# Patient Record
Sex: Female | Born: 1945
Health system: Southern US, Community
[De-identification: ages and names within clinical notes are randomized; demographics above are authoritative.]

## PROBLEM LIST (undated history)

## (undated) DIAGNOSIS — F419 Anxiety disorder, unspecified: Secondary | ICD-10-CM

## (undated) DIAGNOSIS — H3530 Unspecified macular degeneration: Secondary | ICD-10-CM

## (undated) DIAGNOSIS — R0602 Shortness of breath: Secondary | ICD-10-CM

## (undated) DIAGNOSIS — F99 Mental disorder, not otherwise specified: Secondary | ICD-10-CM

## (undated) DIAGNOSIS — I10 Essential (primary) hypertension: Secondary | ICD-10-CM

## (undated) DIAGNOSIS — G459 Transient cerebral ischemic attack, unspecified: Secondary | ICD-10-CM

## (undated) DIAGNOSIS — F32A Depression, unspecified: Secondary | ICD-10-CM

## (undated) DIAGNOSIS — F329 Major depressive disorder, single episode, unspecified: Secondary | ICD-10-CM

## (undated) DIAGNOSIS — H269 Unspecified cataract: Secondary | ICD-10-CM

## (undated) DIAGNOSIS — K219 Gastro-esophageal reflux disease without esophagitis: Secondary | ICD-10-CM

## (undated) DIAGNOSIS — N189 Chronic kidney disease, unspecified: Secondary | ICD-10-CM

## (undated) DIAGNOSIS — F3181 Bipolar II disorder: Secondary | ICD-10-CM

## (undated) DIAGNOSIS — J449 Chronic obstructive pulmonary disease, unspecified: Secondary | ICD-10-CM

## (undated) DIAGNOSIS — M1990 Unspecified osteoarthritis, unspecified site: Secondary | ICD-10-CM

## (undated) HISTORY — PX: APPENDECTOMY: SHX54

## (undated) HISTORY — PX: CATARACT EXTRACTION, BILATERAL: SHX1313

## (undated) HISTORY — PX: INNER EAR SURGERY: SHX679

## (undated) HISTORY — DX: Unspecified cataract: H26.9

## (undated) HISTORY — PX: CHOLECYSTECTOMY: SHX55

## (undated) HISTORY — PX: CARDIAC CATHETERIZATION: SHX172

## (undated) HISTORY — PX: ABDOMINAL HYSTERECTOMY: SHX81

## (undated) HISTORY — DX: Unspecified macular degeneration: H35.30

## (undated) HISTORY — PX: COLON SURGERY: SHX602

---

## 1975-03-05 HISTORY — PX: FOOT SURGERY: SHX648

## 1976-03-04 HISTORY — PX: CHOLECYSTECTOMY: SHX55

## 1976-03-04 HISTORY — PX: APPENDECTOMY: SHX54

## 1978-03-04 HISTORY — PX: ABDOMINAL HYSTERECTOMY: SHX81

## 1985-03-04 HISTORY — PX: BREAST CYST ASPIRATION: SHX578

## 1993-03-04 HISTORY — PX: CARDIAC CATHETERIZATION: SHX172

## 1997-09-15 ENCOUNTER — Ambulatory Visit (HOSPITAL_COMMUNITY): Admission: RE | Admit: 1997-09-15 | Discharge: 1997-09-15 | Payer: Self-pay | Admitting: General Surgery

## 1999-06-08 ENCOUNTER — Ambulatory Visit (HOSPITAL_COMMUNITY): Admission: RE | Admit: 1999-06-08 | Discharge: 1999-06-08 | Payer: Self-pay | Admitting: Internal Medicine

## 1999-06-08 ENCOUNTER — Encounter: Payer: Self-pay | Admitting: Internal Medicine

## 1999-07-09 ENCOUNTER — Ambulatory Visit (HOSPITAL_COMMUNITY): Admission: RE | Admit: 1999-07-09 | Discharge: 1999-07-09 | Payer: Self-pay | Admitting: Internal Medicine

## 1999-07-15 ENCOUNTER — Ambulatory Visit (HOSPITAL_COMMUNITY): Admission: RE | Admit: 1999-07-15 | Discharge: 1999-07-15 | Payer: Self-pay | Admitting: Psychiatry

## 2000-06-30 ENCOUNTER — Encounter: Admission: RE | Admit: 2000-06-30 | Discharge: 2000-06-30 | Payer: Self-pay | Admitting: Internal Medicine

## 2000-06-30 ENCOUNTER — Encounter: Payer: Self-pay | Admitting: Internal Medicine

## 2000-07-03 ENCOUNTER — Encounter: Payer: Self-pay | Admitting: Internal Medicine

## 2000-07-03 ENCOUNTER — Encounter: Admission: RE | Admit: 2000-07-03 | Discharge: 2000-07-03 | Payer: Self-pay | Admitting: Internal Medicine

## 2000-07-30 ENCOUNTER — Encounter: Payer: Self-pay | Admitting: Gastroenterology

## 2000-07-30 ENCOUNTER — Ambulatory Visit (HOSPITAL_COMMUNITY): Admission: RE | Admit: 2000-07-30 | Discharge: 2000-07-30 | Payer: Self-pay | Admitting: Gastroenterology

## 2000-08-28 ENCOUNTER — Ambulatory Visit (HOSPITAL_COMMUNITY): Admission: RE | Admit: 2000-08-28 | Discharge: 2000-08-28 | Payer: Self-pay | Admitting: Internal Medicine

## 2000-08-28 ENCOUNTER — Encounter: Payer: Self-pay | Admitting: Internal Medicine

## 2000-09-02 ENCOUNTER — Encounter: Payer: Self-pay | Admitting: Internal Medicine

## 2000-09-02 ENCOUNTER — Encounter: Admission: RE | Admit: 2000-09-02 | Discharge: 2000-09-02 | Payer: Self-pay | Admitting: Internal Medicine

## 2001-03-04 HISTORY — PX: PARTIAL COLECTOMY: SHX5273

## 2002-01-04 ENCOUNTER — Encounter (INDEPENDENT_AMBULATORY_CARE_PROVIDER_SITE_OTHER): Payer: Self-pay | Admitting: *Deleted

## 2002-01-04 ENCOUNTER — Ambulatory Visit (HOSPITAL_COMMUNITY): Admission: RE | Admit: 2002-01-04 | Discharge: 2002-01-04 | Payer: Self-pay | Admitting: Gastroenterology

## 2002-01-13 ENCOUNTER — Encounter: Payer: Self-pay | Admitting: General Surgery

## 2002-01-15 ENCOUNTER — Inpatient Hospital Stay (HOSPITAL_COMMUNITY): Admission: RE | Admit: 2002-01-15 | Discharge: 2002-01-20 | Payer: Self-pay | Admitting: General Surgery

## 2002-01-15 ENCOUNTER — Encounter (INDEPENDENT_AMBULATORY_CARE_PROVIDER_SITE_OTHER): Payer: Self-pay | Admitting: *Deleted

## 2002-06-28 ENCOUNTER — Other Ambulatory Visit (HOSPITAL_COMMUNITY): Admission: RE | Admit: 2002-06-28 | Discharge: 2002-07-13 | Payer: Self-pay | Admitting: Psychiatry

## 2003-01-12 ENCOUNTER — Observation Stay (HOSPITAL_COMMUNITY): Admission: AD | Admit: 2003-01-12 | Discharge: 2003-01-13 | Payer: Self-pay | Admitting: Internal Medicine

## 2003-02-08 ENCOUNTER — Ambulatory Visit (HOSPITAL_COMMUNITY): Admission: RE | Admit: 2003-02-08 | Discharge: 2003-02-08 | Payer: Self-pay | Admitting: Internal Medicine

## 2003-03-23 ENCOUNTER — Encounter: Admission: RE | Admit: 2003-03-23 | Discharge: 2003-03-23 | Payer: Self-pay | Admitting: Internal Medicine

## 2004-10-05 ENCOUNTER — Other Ambulatory Visit (HOSPITAL_COMMUNITY): Admission: RE | Admit: 2004-10-05 | Discharge: 2004-10-24 | Payer: Self-pay | Admitting: Psychiatry

## 2004-10-05 ENCOUNTER — Ambulatory Visit: Payer: Self-pay | Admitting: Psychiatry

## 2006-06-25 ENCOUNTER — Encounter: Admission: RE | Admit: 2006-06-25 | Discharge: 2006-06-25 | Payer: Self-pay | Admitting: Internal Medicine

## 2006-09-18 ENCOUNTER — Ambulatory Visit (HOSPITAL_COMMUNITY): Admission: RE | Admit: 2006-09-18 | Discharge: 2006-09-18 | Payer: Self-pay | Admitting: Internal Medicine

## 2006-09-26 ENCOUNTER — Encounter: Admission: RE | Admit: 2006-09-26 | Discharge: 2006-09-26 | Payer: Self-pay | Admitting: Internal Medicine

## 2007-03-18 ENCOUNTER — Encounter: Admission: RE | Admit: 2007-03-18 | Discharge: 2007-03-18 | Payer: Self-pay | Admitting: Internal Medicine

## 2007-09-21 ENCOUNTER — Encounter: Admission: RE | Admit: 2007-09-21 | Discharge: 2007-09-21 | Payer: Self-pay | Admitting: Internal Medicine

## 2008-10-06 ENCOUNTER — Encounter: Admission: RE | Admit: 2008-10-06 | Discharge: 2008-10-06 | Payer: Self-pay | Admitting: Internal Medicine

## 2009-11-29 ENCOUNTER — Encounter: Admission: RE | Admit: 2009-11-29 | Discharge: 2009-11-29 | Payer: Self-pay | Admitting: *Deleted

## 2010-03-25 ENCOUNTER — Encounter: Payer: Self-pay | Admitting: Internal Medicine

## 2010-07-20 NOTE — Consult Note (Signed)
NAME:  Caitlin Galloway, Caitlin Galloway                          ACCOUNT NO.:  [id number]   MEDICAL RECORD NO.:  [id number]                   PATIENT TYPE:  INP   LOCATION:  2041                                 FACILITY:  [facility]   PHYSICIAN:  Lesleigh Noe, M.D.            DATE OF BIRTH:  08-29-1945   DATE OF CONSULTATION:  01/12/2003  DATE OF DISCHARGE:                                   CONSULTATION   CONCLUSIONS:  1. Chest tightness and dyspnea, etiology uncertain.  Chest discomfort     started January 06, 2003, and dyspnea has been increasing over the past     four months.  2. Hyperlipidemia with elevated HDL.  3. History of cigarette smoking greater than 35 years.  4. History of villous adenoma.  5. Hypokalemia.   RECOMMENDATIONS:  1. Subcutaneous Lovenox.  2. Beta blocker therapy.  3. IV nitroglycerin if continued pain.  4. EKG.  5. Coronary angiography January 13, 2003.   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  Over the past four months, she has had  dyspnea on exertion, increasingly severe, and over the past week to ten  days, recurring chest tightness that has been nonpredictable occurring both  with rest and during exertion.  She currently has a mild continuing  sensation of pressure in her chest at the time of this interview.   FAMILY HISTORY:  Father died at age 22 of COPD, had history of CVA.  Mother  is alive and well.  Has five siblings, all in good health.   HABITS:  Greater than one pack per day for 35 years.  Denies significant  alcohol consumption, but does have one to two drinks per month.   MEDICATIONS:  1. Zoloft.  2. Estrace.  3. Clonopin.  4. Aspirin.   PHYSICAL EXAMINATION:  GENERAL APPEARANCE:  Pam appears comfortable.  VITAL SIGNS:  Blood pressure 147/80, heart rate 80.  HEENT:  Unremarkable.  No xanthelasma.  NECK:  No JVD or carotid bruits.  LUNGS:  Clear to auscultation and  percussion.  CARDIAC:  Normal.  ABDOMEN:  Soft.  EXTREMITIES:  No edema.   LABORATORY DATA:  Potassium 3.3, EKG normal.   DISCUSSION:  The patient has risk factors for coronary disease,  predominantly her age and cigarette smoking.  Under the circumstances and  with continued chest discomfort responsive to nitroglycerin, coronary  angiography needs to be done to exclude coronary artery disease.  Other  potential explanations for her symptoms are gastroesophageal reflux, stress  related and other.                                               Lesleigh Noe, M.D.  HWS/MEDQ  D:  01/13/2003  T:  01/13/2003  Job:  [id number]

## 2010-07-20 NOTE — Cardiovascular Report (Signed)
NAME:  Caitlin Galloway, Caitlin Galloway                          ACCOUNT NO.:  [id number]   MEDICAL RECORD NO.:  [id number]                   PATIENT TYPE:  INP   LOCATION:  2041                                 FACILITY:  [facility]   PHYSICIAN:  Lesleigh Noe, M.D.            DATE OF BIRTH:  20-Jun-1945   DATE OF PROCEDURE:  01/13/2003  DATE OF DISCHARGE:                              CARDIAC CATHETERIZATION   INDICATIONS FOR PROCEDURE:  Recurrent chest pain syndrome compatible with  potential unstable angina.  The procedure is being done to rule out coronary  artery disease and to include or exclude the possibility of heart related  pain.   DATE OF PROCEDURE:  January 13, 2003.   PROCEDURE PERFORMED:  1. Left heart catheterization.  2. Selective coronary angiography.  3. Left ventriculography.   DESCRIPTION:  After informed consent, a 6-French sheath was inserted in the  right femoral artery using modified Seldinger technique.  A 6-French A2  multipurpose catheter was used for hemodynamic recordings, left  ventriculography and selective left and right coronary angiography.  The  patient tolerated the procedure without significant complications.   RESULTS:   I. HEMODYNAMIC DATA:  A.  Aortic pressure 159/82.  B.  Left ventricular pressure 157/6mmHg.   II. LEFT VENTRICULOGRAPHY:  The left ventricle appears normal in size and  there is normal contractility.  EF is 65%.   III. CORONARY ANGIOGRAPHY:  A.  Left main coronary:  Left main coronary  artery is normal.  B.  Left anterior descending coronary:  The left anterior descending  coronary artery is normal.  No significant obstructive lesions are noted.  A  relatively small diagonal arises from the mid vessel and the from the distal  third of the vessel.  C.  Circumflex artery:  The circumflex artery is a large vessel that gives  origin to four obtuse marginal branches.  The third obtuse marginal is a  dominant vessel and bifurcates on the  inferolateral wall.  There are luminal  irregularities with 30% narrowing in the continuation of the circumflex  beyond the third obtuse marginal.  D.  Right coronary:  The right coronary artery is dominant and is free of  any significant obstruction.  PDA is free of any abnormalities.   CONCLUSIONS:  1. Minimal coronary atherosclerosis involving primarily the distal     circumflex with irregularities noted, but no high grade obstruction.  2. Normal left ventricular function.  3. Chest discomfort, probably nonischemic in origin.    PLAN:  1. No further cardiac evaluation for ischemic etiology of discomfort.  2. Clinic followup per Dr. Earl Gala.                                               Lesleigh Noe, M.D.  HWS/MEDQ  D:  01/13/2003  T:  01/13/2003  Job:  [id number]

## 2010-07-20 NOTE — Op Note (Signed)
NAME:  Caitlin Galloway, Caitlin Galloway                          ACCOUNT NO.:  [id number]   MEDICAL RECORD NO.:  [id number]                   PATIENT TYPE:  OUT   LOCATION:  MAMO                                 FACILITY:  [facility]   PHYSICIAN:  James L. Malon Kindle., M.D.          DATE OF BIRTH:  1946-02-07   DATE OF PROCEDURE:  01/04/2002  DATE OF DISCHARGE:                                 OPERATIVE REPORT   PROCEDURE PERFORMED:  Colonoscopy and biopsy.   ENDOSCOPIST:  Llana Aliment. Edwards, M.D.   MEDICATIONS:  Fentanyl 100 mcg, Versed 8 mg IV.   INSTRUMENT USED:  Pediatric Olympus colonoscope.   INDICATIONS FOR PROCEDURE:  Recent diarrhea, loose stools.  I saw her back  in July for this.  Work-up was negative.  Stools were negative.  We thought  that she probably had an infectious diarrhea since she was getting better.  I cultured her stools.  This all seemed to improve but she still had loose  stools.  In addition to this, she has family history of colon polyps.  For  these reasons a colonoscopy was performed.   DESCRIPTION OF PROCEDURE:  The procedure had been explained to the patient  and consent obtained.  With the patient in the left lateral decubitus  position, the scope was inserted and advanced under direct visualization.  The prep was excellent.  We were able to reach the cecum.  The ileocecal  valve was seen.  The scope was withdrawn and the cecum, ascending colon were  seen.  Upon reaching the cecum, the ileocecal valve was clearly seen.  Across from the ileocecal valve was a large polypoid mass that was estimated  to be 3 to 4 cm in diameter basically filling the entire wall of the cecum  across from the ileocecal valve.  This was hard, friable and ulcerated.  Multiple biopsies were obtained.  It was not felt to be something that was  endoscopically amenable to therapy.  The scope was withdrawn.  The ascending  colon, transverse, descending and sigmoid colon were normal.  There were no  signs of active colitis.  The patient had had diarrhea so in the descending  and sigmoid colon, multiple random biopsies were obtained and placed in jar  #2.  The scope was withdrawn.  The patient tolerated the procedure well.  The patient was monitored throughout and had good oxygen saturations with no  obvious problems.    ASSESSMENT:  Cecal mass, probably polyp or carcinoma.  It is not  endoscopically amenable to therapy with grossly normal mucosa; biopsies  obtained randomly to look for microscopic colitis.   PLAN:  Will check pathology.  Will obtained a surgical consultation.  James L. Malon Kindle., M.D.    Waldron Session  D:  01/04/2002  T:  01/04/2002  Job:  [id number]   cc:   Theressa Millard, M.D.  [address] [place]  [place] [address]  Fax: [phone]   Gita Kudo, M.D.  Fax: [phone]

## 2010-07-20 NOTE — Discharge Summary (Signed)
   NAME:  Caitlin Galloway, Caitlin Galloway                          ACCOUNT NO.:  [id number]   MEDICAL RECORD NO.:  [id number]                   PATIENT TYPE:  INP   LOCATION:  5737                                 FACILITY:  [facility]   PHYSICIAN:  Gita Kudo, M.D.              DATE OF BIRTH:  1945-11-27   DATE OF ADMISSION:  01/15/2002  DATE OF DISCHARGE:  01/20/2002                                 DISCHARGE SUMMARY   CHIEF COMPLAINT:  Colon lesion, found on colonoscopy, admitted for elective  surgery.   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  No family history of colon cancer.  Her past medical  history showed cholecystectomy, hysterectomy.  She takes esterase, Lexapro,  and Klonopin.   LABORATORY DATA:  Pathology:  Right colon and terminal ileum with  tubovillous adenoma, 3.5 cm.  No malignancy.   Chest x-ray showed no evidence of infiltrate or congestive heart failure.  Oblique scarring atelectasis, right middle lobe.   EKG:  Sinus bradycardia.   CBC, CMET, UA all within normal limits.   HOSPITAL COURSE:  On the morning of admission, the patient underwent a right  colectomy without complication.  Postoperatively, she did well.  She  regained all function, was comfortable, afebrile, and had no complication.  She did have a little right eye irritation that was treated with antibiotic  drops with good resolution.  On her fifth postoperative day, her staples  were removed; she was allowed home.  She was going home on a regular diet,  limited activity, Percocet for pain and followup as instructed.   DISCHARGE DIAGNOSIS:  Tubovillous adenoma, right colon.   OPERATION:  01/15/2002, right colectomy.    COMPLICATIONS, INFECTIONS, CONSULTATIONS:  None.   CONDITION ON DISCHARGE:  Good.                                                Gita Kudo, M.D.    MRL/MEDQ  D:  01/20/2002  T:  01/20/2002  Job:  [id number]   cc:   Theressa Millard, M.D.  [address] [place]  [place] [address]  Fax: [phone]   Llana Aliment. Malon Kindle., M.D.  [address]  [place]  [place] [address]  Fax: [phone]

## 2010-07-20 NOTE — Op Note (Signed)
NAME:  Caitlin Galloway, Caitlin Galloway                          ACCOUNT NO.:  [id number]   MEDICAL RECORD NO.:  [id number]                   PATIENT TYPE:  INP   LOCATION:  5731                                 FACILITY:  [facility]   PHYSICIAN:  Gita Kudo, M.D.              DATE OF BIRTH:  [birth year]   DATE OF PROCEDURE:  01/15/2002  DATE OF DISCHARGE:                                 OPERATIVE REPORT   PREOPERATIVE DIAGNOSIS:  Lesion of right colon.   POSTOPERATIVE DIAGNOSIS:  Lesion of right colon, probably benign per gross  exam by pathology.  Final diagnosis pending permanent result.   OPERATIVE PROCEDURE:  Right colon resection.   SURGEON:  Gita Kudo, M.D.   ASSISTANT:  Donnie Coffin. Samuella Cota, M.D.   ANESTHESIA:  general endotracheal.   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  It is at least 3 cm in  size and needs to be resected.   OPERATIVE FINDINGS:  The patient had adhesions from previous gallbladder and  hysterectomy.  The liver looked and felt normal.  The colon had a small  lesion at the cecum.  There was no evidence of any nodularity or fluid in  the abdomen.  The previous history of pancreatitis led to manual exam of the  pancreas, which felt normal.   DESCRIPTION OF PROCEDURE:  Under satisfactory general endotracheal  anesthesia, having received intravenous Cefotan and IM heparin, the  patient's abdomen was prepped and draped in a standard fashion.  A  transverse incision was made at the umbilicus and carried into the abdominal  cavity.  Bleeders were coagulated or tied with 2-0 silk.  Adhesions were  taken down and then laparotomy performed and self-retaining retractor  placed.  The abdomen was actually quite free of any dense adhesions.  The  colon was retracted medially and the peritoneal reflexion taken down and  extended to the distal ileum.  At that point, approximately 2 inches  proximal to the cecum, the ileum was transected with a GIA.  Then the  hepatic flexure was taken down between clamps and ties of 2-0 silk, again  using Bovie also and at the most proximal right portion of the transverse  colon it was divided with the GIA stapler.  The middle colic vessels were  carefully preserved.  The mesentery was then scored with the Bovie and the  vessels divided between clamps and ties of 2-0 silk and the specimen removed  and sent to pathology.  The duodenum and ureter were identified and not  injured.  The bowel was then aligned in a side-to-side fashion and a GIA  stapler used to performed a side-to-side end-to-end functional anastomosis.  The staple line was checked for hemostasis and secured with interrupted 3-0  pop-off sutures and then the staple line stab wounds closed with  interrupted  3-0 silk in simple and [place] fashion.  The anastomosis lay without tension,  was patent, and had good blood supply.  Gloves were changed and the abdomen  was lavaged with saline.  The mesentery was closed with interrupted 2-0 silk  suture.  Then the anastomosis was replaced in the right gutter and small  bowel over this and then the omentum.  Again the abdomen was lavaged with  saline and suctioned dry.  The sponge and needle counts were correct and the  closure accomplished with running 1-0 PDS suture in layers.  The incision  was infiltrated with Marcaine and then the skin edges approximated with  staples.  Sterile dressings were then applied and the patient went to the  recovery room from the operating room in good condition.   OPERATIVE TIME:  Approximately 1 hour and 30 minutes.   BLOOD LOSS:  Minimal.   COMPLICATIONS:  None.   DRAINS:  None.                                               Gita Kudo, M.D.    MRL/MEDQ  D:  01/15/2002  T:  01/16/2002  Job:  [id number]   cc:   Fayrene Fearing L. Malon Kindle., M.D.  [address]  [place]  [place] [address]   Fax: [phone]   Theressa Millard, M.D.  [address] [place]  [place] [address]  Fax: [phone]

## 2011-01-29 ENCOUNTER — Other Ambulatory Visit: Payer: Self-pay | Admitting: Internal Medicine

## 2011-01-29 DIAGNOSIS — Z1231 Encounter for screening mammogram for malignant neoplasm of breast: Secondary | ICD-10-CM

## 2011-02-21 ENCOUNTER — Ambulatory Visit: Payer: Self-pay

## 2011-03-28 ENCOUNTER — Ambulatory Visit
Admission: RE | Admit: 2011-03-28 | Discharge: 2011-03-28 | Disposition: A | Discharge status: Home / Self Care | Payer: Medicare Other | Source: Ambulatory Visit | Attending: Internal Medicine | Admitting: Internal Medicine

## 2011-03-28 DIAGNOSIS — Z1231 Encounter for screening mammogram for malignant neoplasm of breast: Secondary | ICD-10-CM

## 2011-10-29 ENCOUNTER — Ambulatory Visit (INDEPENDENT_AMBULATORY_CARE_PROVIDER_SITE_OTHER): Payer: Medicare Other | Admitting: Family Medicine

## 2011-10-29 ENCOUNTER — Ambulatory Visit: Payer: Medicare Other

## 2011-10-29 VITALS — BP 154/85 | HR 90 | Temp 98.2°F | Resp 18 | Wt 168.0 lb

## 2011-10-29 DIAGNOSIS — S63509A Unspecified sprain of unspecified wrist, initial encounter: Secondary | ICD-10-CM

## 2011-10-29 DIAGNOSIS — M25539 Pain in unspecified wrist: Secondary | ICD-10-CM

## 2011-10-29 NOTE — Patient Instructions (Signed)
Wear splint Advil 2 twice daily

## 2011-10-29 NOTE — Progress Notes (Signed)
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 She is left handed, so it is not present for her. The cord snapped tight and yanked her wrist backwards. She had immediate pain in the wrist comment it swelled up quickly. It hurts to pronate or supinate from the start. She did not hear a definite crack.  Object Patient is holding her wrist up from the pain. There is obvious swelling proximal to the joint itself. Mild discoloration and ecchymotic appearance distal to the swollen area. There is pain on movement of the wrist, and tenderness to palpation in the distal forearm, especially the ulnar side.  Assessment: Procedure in pain  Plan: X-ray wrist  [facility] reading (PRIMARY) by  Dr. Alwyn Ren Normal  Nsaids, splint .

## 2011-10-29 NOTE — Progress Notes (Signed)
  Subjective:    Patient ID: Caitlin Galloway, female    DOB: 10-15-1945, 66 y.o.   MRN: [id number]  HPI    Review of Systems     Objective:   Physical Exam        Assessment & Plan:

## 2012-05-05 ENCOUNTER — Observation Stay (HOSPITAL_COMMUNITY)
Admission: EM | Admit: 2012-05-05 | Discharge: 2012-05-06 | Disposition: A | Discharge status: Home / Self Care | Payer: Medicare Other | Attending: Internal Medicine | Admitting: Internal Medicine

## 2012-05-05 ENCOUNTER — Encounter (HOSPITAL_COMMUNITY): Payer: Self-pay

## 2012-05-05 ENCOUNTER — Emergency Department (HOSPITAL_COMMUNITY): Payer: Medicare Other

## 2012-05-05 ENCOUNTER — Inpatient Hospital Stay (HOSPITAL_COMMUNITY): Payer: Medicare Other

## 2012-05-05 DIAGNOSIS — G459 Transient cerebral ischemic attack, unspecified: Principal | ICD-10-CM | POA: Insufficient documentation

## 2012-05-05 DIAGNOSIS — Z9181 History of falling: Secondary | ICD-10-CM | POA: Insufficient documentation

## 2012-05-05 DIAGNOSIS — F17200 Nicotine dependence, unspecified, uncomplicated: Secondary | ICD-10-CM | POA: Insufficient documentation

## 2012-05-05 DIAGNOSIS — R29898 Other symptoms and signs involving the musculoskeletal system: Secondary | ICD-10-CM | POA: Insufficient documentation

## 2012-05-05 DIAGNOSIS — Z720 Tobacco use: Secondary | ICD-10-CM | POA: Diagnosis present

## 2012-05-05 DIAGNOSIS — E876 Hypokalemia: Secondary | ICD-10-CM | POA: Insufficient documentation

## 2012-05-05 DIAGNOSIS — F319 Bipolar disorder, unspecified: Secondary | ICD-10-CM | POA: Insufficient documentation

## 2012-05-05 DIAGNOSIS — J441 Chronic obstructive pulmonary disease with (acute) exacerbation: Secondary | ICD-10-CM | POA: Insufficient documentation

## 2012-05-05 HISTORY — DX: Transient cerebral ischemic attack, unspecified: G45.9

## 2012-05-05 HISTORY — DX: Mental disorder, not otherwise specified: F99

## 2012-05-05 HISTORY — DX: Major depressive disorder, single episode, unspecified: F32.9

## 2012-05-05 HISTORY — DX: Gastro-esophageal reflux disease without esophagitis: K21.9

## 2012-05-05 HISTORY — DX: Chronic obstructive pulmonary disease, unspecified: J44.9

## 2012-05-05 HISTORY — DX: Shortness of breath: R06.02

## 2012-05-05 HISTORY — DX: Anxiety disorder, unspecified: F41.9

## 2012-05-05 HISTORY — DX: Depression, unspecified: F32.A

## 2012-05-05 LAB — URINALYSIS, ROUTINE W REFLEX MICROSCOPIC
Bilirubin Urine: NEGATIVE
Glucose, UA: NEGATIVE mg/dL
Hgb urine dipstick: NEGATIVE
Specific Gravity, Urine: 1.009 (ref 1.005–1.030)
Urobilinogen, UA: 0.2 mg/dL (ref 0.0–1.0)

## 2012-05-05 LAB — APTT: aPTT: 29 seconds (ref 24–37)

## 2012-05-05 LAB — CBC
HCT: 44.1 % (ref 36.0–46.0)
MCHC: 36.7 g/dL — ABNORMAL HIGH (ref 30.0–36.0)
MCV: 90.9 fL (ref 78.0–100.0)
RDW: 13.3 % (ref 11.5–15.5)

## 2012-05-05 LAB — PROTIME-INR
INR: 0.88 (ref 0.00–1.49)
Prothrombin Time: 11.9 s (ref 11.6–15.2)

## 2012-05-05 LAB — RAPID URINE DRUG SCREEN, HOSP PERFORMED
Amphetamines: NOT DETECTED
Cocaine: NOT DETECTED
Opiates: NOT DETECTED
Tetrahydrocannabinol: NOT DETECTED

## 2012-05-05 LAB — GLUCOSE, CAPILLARY: Glucose-Capillary: 141 mg/dL — ABNORMAL HIGH (ref 70–99)

## 2012-05-05 LAB — COMPREHENSIVE METABOLIC PANEL
Albumin: 3.6 g/dL (ref 3.5–5.2)
BUN: 9 mg/dL (ref 6–23)
Creatinine, Ser: 0.74 mg/dL (ref 0.50–1.10)
GFR calc Af Amer: >90 mL/min (ref >90)
Total Bilirubin: 0.2 mg/dL — ABNORMAL LOW (ref 0.3–1.2)
Total Protein: 7.2 g/dL (ref 6.0–8.3)

## 2012-05-05 LAB — TROPONIN I: Troponin I: <0.3 ng/mL

## 2012-05-05 LAB — URINE MICROSCOPIC-ADD ON

## 2012-05-05 MED ORDER — ENOXAPARIN SODIUM 40 MG/0.4ML ~~LOC~~ SOLN
40.0000 mg | SUBCUTANEOUS | Status: DC
  Filled 2012-05-05: qty 0.4

## 2012-05-05 MED ORDER — ASPIRIN 325 MG PO TABS
325.0000 mg | ORAL_TABLET | Freq: Once | ORAL | Status: AC
  Administered 2012-05-05: 325 mg via ORAL
  Filled 2012-05-05: qty 1

## 2012-05-05 MED ORDER — ENOXAPARIN SODIUM 30 MG/0.3ML ~~LOC~~ SOLN
40.0000 mg | SUBCUTANEOUS | Status: DC
  Filled 2012-05-05: qty 0.4

## 2012-05-05 MED ORDER — STUDY - INVESTIGATIONAL DRUG SIMPLE RECORD
600.0000 mg | Status: AC
  Administered 2012-05-05: 600 mg via ORAL
  Filled 2012-05-05: qty 600

## 2012-05-05 MED ORDER — ACETAMINOPHEN 325 MG PO TABS
650.0000 mg | ORAL_TABLET | ORAL | Status: DC | PRN
  Administered 2012-05-06 (×2): 650 mg via ORAL
  Filled 2012-05-05 (×2): qty 2

## 2012-05-05 MED ORDER — CLONAZEPAM 1 MG PO TABS
1.0000 mg | ORAL_TABLET | Freq: Two times a day (BID) | ORAL | Status: DC | PRN
  Administered 2012-05-06: 1 mg via ORAL
  Filled 2012-05-05: qty 1

## 2012-05-05 MED ORDER — SENNOSIDES-DOCUSATE SODIUM 8.6-50 MG PO TABS: 1.0000 | ORAL_TABLET | Freq: Every evening | ORAL | Status: DC | PRN

## 2012-05-05 MED ORDER — SODIUM CHLORIDE 0.9 % IV SOLN
INTRAVENOUS | Status: DC
  Administered 2012-05-06: 1000 mL via INTRAVENOUS
  Administered 2012-05-06: 01:00:00 via INTRAVENOUS

## 2012-05-05 MED ORDER — STUDY - INVESTIGATIONAL DRUG SIMPLE RECORD
75.0000 mg | Freq: Every day | Status: DC
  Administered 2012-05-06: 75 mg via ORAL
  Filled 2012-05-05 (×2): qty 75

## 2012-05-05 MED ORDER — NORTRIPTYLINE HCL 25 MG PO CAPS
25.0000 mg | ORAL_CAPSULE | Freq: Every day | ORAL | Status: DC
  Filled 2012-05-05 (×3): qty 1

## 2012-05-05 MED ORDER — CLOPIDOGREL BISULFATE 75 MG PO TABS
75.0000 mg | ORAL_TABLET | Freq: Every day | ORAL | Status: DC
  Administered 2012-05-06: 75 mg via ORAL
  Filled 2012-05-05 (×2): qty 1

## 2012-05-05 MED ORDER — SERTRALINE HCL 50 MG PO TABS
75.0000 mg | ORAL_TABLET | Freq: Every day | ORAL | Status: DC
  Administered 2012-05-06: 75 mg via ORAL
  Filled 2012-05-05: qty 1

## 2012-05-05 NOTE — ED Notes (Signed)
This afternoon pt. Began to have lt. Arm weakness to the point. Was unable to pick up a glass and then the numbness went into the lt. Leg. And when pt. Stood up to go to the bathroom, she had to drag her lt. Leg. And she fell  Onto carpeted floor.   Denies any LOC or injuries/ Paramedics arrived pt. Was continuing with the lt. Side weakness and CBG was 68.  Pt. Was given oral glucose and began to feel better.   Upon arrival to ED lt. Arm weakness numbness has resolved. Lt leg weakness had improved,  Pt. Did stand and walk prior to getting into the stretcher.  Pt. Is alert and oriented x4  Denies any pain

## 2012-05-05 NOTE — ED Notes (Signed)
Pt placed on cardiac portable monitor for transport

## 2012-05-05 NOTE — ED Notes (Signed)
Pt returns from ct scan, family at bedside. 

## 2012-05-05 NOTE — ED Notes (Signed)
EMT to transport pt. Ticket to ride with EMT

## 2012-05-05 NOTE — ED Provider Notes (Signed)
History     CSN: [id number]  Arrival date & time 05/05/12  1743   First MD Initiated Contact with Patient 05/05/12 1750      Chief Complaint  Patient presents with  . Weakness    HPI Patient reports acute onset numbness and weakness of her left upper extremity followed by weakness of her left lower extremity which began at approximately 4:10 PM today.  She was in her normal state of health prior to this.  This occurred acutely.  She then fell because she was unable to stand given the left leg weakness.  She was able to crawl to the door.  At one point she could barely move her left arm.  She had no speech changes.  She was able to call EMS and call her family.  After approximately one hour symptoms began to slowly improve.  Her blood sugar on EMS arrival was 68 and she states that they start to give her glucose is felt like her symptoms improved somewhat after glucose.  No prior history of stroke.  Only stroke risk factors tobacco abuse.  She's never had a stroke and never been worked up for TIA.  Her symptoms were moderate to severe in severity when it occurred.  She has no symptoms at this time.  She reports almost complete strength back in her left arm and left leg.  No altered mental status.  Nothing worsens or improves her symptoms.   Past Medical History  Diagnosis Date  . Anxiety     Past Surgical History  Procedure Laterality Date  . Abdominal hysterectomy    . Cholecystectomy    . Appendectomy    . Colon surgery      No family history on file.  History  Substance Use Topics  . Smoking status: Current Every Day Smoker    Types: Cigarettes  . Smokeless tobacco: Not on file  . Alcohol Use: Yes     Comment: occassional     OB History   Grav Para Term Preterm Abortions TAB SAB Ect Mult Living                  Review of Systems  All other systems reviewed and are negative.    Allergies  Codeine; Demerol; Doxycycline; Effexor; Pneumovax; and Sulfa  antibiotics  Home Medications   Current Outpatient Rx  Name  Route  Sig  Dispense  Refill  . aspirin 325 MG tablet   Oral   Take 162.5 mg by mouth daily.         . clonazePAM (KLONOPIN) 1 MG tablet   Oral   Take 1 mg by mouth 2 (two) times daily as needed for anxiety.          . nortriptyline (PAMELOR) 25 MG capsule   Oral   Take 25 mg by mouth at bedtime.         . sertraline (ZOLOFT) 25 MG tablet   Oral   Take 75 mg by mouth daily.           BP 151/75  Pulse 79  Temp(Src) 98.6 F (37 C) (Oral)  Resp 14  SpO2 97%  Physical Exam  Nursing note and vitals reviewed. Constitutional: She is oriented to person, place, and time. She appears well-developed and well-nourished. No distress.  HENT:  Head: Normocephalic and atraumatic.  Eyes: EOM are normal. Pupils are equal, round, and reactive to light.  Neck: Normal range of motion.  Cardiovascular: Normal rate, regular  rhythm and normal heart sounds.   Pulmonary/Chest: Effort normal and breath sounds normal.  Abdominal: Soft. She exhibits no distension. There is no tenderness.  Musculoskeletal: Normal range of motion.  Neurological: She is alert and oriented to person, place, and time.  5/5 strength in major muscle groups of  bilateral upper and lower extremities. Speech normal. No facial asymetry.   Skin: Skin is warm and dry.  Psychiatric: She has a normal mood and affect. Judgment normal.    ED Course  Procedures (including critical care time)   Date: 05/05/2012  Rate: 66  Rhythm: normal sinus rhythm  QRS Axis: normal  Intervals: normal  ST/T Wave abnormalities: normal  Conduction Disutrbances: none  Narrative Interpretation:   Old EKG Reviewed: No significant changes noted     Labs Reviewed  GLUCOSE, CAPILLARY - Abnormal; Notable for the following:    Glucose-Capillary 161 (*)    All other components within normal limits  CBC - Abnormal; Notable for the following:    Hemoglobin 16.2 (*)     MCHC 36.7 (*)    All other components within normal limits  COMPREHENSIVE METABOLIC PANEL - Abnormal; Notable for the following:    Alkaline Phosphatase 124 (*)    Total Bilirubin 0.2 (*)    GFR calc non Af Amer 87 (*)    All other components within normal limits  URINALYSIS, ROUTINE W REFLEX MICROSCOPIC - Abnormal; Notable for the following:    Leukocytes, UA SMALL (*)    All other components within normal limits  ETHANOL  URINE RAPID DRUG SCREEN (HOSP PERFORMED)  TROPONIN I  URINE MICROSCOPIC-ADD ON   Ct Head Wo Contrast  05/05/2012  *RADIOLOGY REPORT*  Clinical Data: Left arm weakness beginning today.  CT HEAD WITHOUT CONTRAST  Technique:  Contiguous axial images were obtained from the base of the skull through the vertex without contrast.  Comparison: Head CT scan 03/23/2003.  Findings: Patchy and confluent hypoattenuation in the subcortical and periventricular deep white matter is markedly progressive since the prior study and consistent with microvascular ischemic change. No evidence of acute intracranial abnormality including infarction, hemorrhage, mass lesion, mass effect, midline shift or abnormal extra-axial fluid collection is identified.  There is no hydrocephalus or pneumocephalus. Cavum septum pellucidum is noted. The calvarium is intact.  Imaged paranasal sinuses and mastoid air cells are clear.  IMPRESSION:  1.  No acute finding. 2.  Extensive chronic microvascular ischemic change.   Original Report Authenticated By: Holley Dexter, M.D.    I personally reviewed the imaging tests through PACS system I reviewed available ER/hospitalization records through the EMR   1. TIA (transient ischemic attack)       MDM  Rapidly improving symptoms.  This appears to be a TIA.  NIH stroke scale 0 on my evaluation.  No indication for TPA given rapidly improving symptoms.  The patient be admitted the hospital for TIA workup.  Aspirin in the ER.  Swallow study.  Neurology to consult.   Hospitalist to admit       Lyanne Co, MD 05/05/12 1946

## 2012-05-05 NOTE — H&P (Signed)
[facility] History and Physical  Caitlin Galloway ZOX:[id number] DOB: [birth year] DOA: 05/05/2012  Referring physician: Dr. Patria Galloway. PCP: Caitlin Bos, MD  Specialists: Dr. Verdis Galloway.  Chief Complaint: Left-sided weakness.  HPI: Caitlin Galloway is a 67 y.o. female with history of bipolar disorder and ongoing tobacco abuse started experiencing left-sided weakness at her home today. The weakness started around 4 PM. It happened while watching TV. Her left upper extremity got weak following which she also felt weak in the left lower extremity. The symptoms were associated with numbness and tingling of the same extremities. Denies any weakness in the right upper lower extremities and any difficulty swallowing speaking or visual symptoms. The symptoms lasted for almost around one hour after which the symptoms got better and has only mild weakness in the left lower extremity. Patient states that her CBG was around 69 and her weakness improved after she ate something. CT head was negative for anything acute. Patient has been evaluated by the neurologist and has been admitted for further management. Patient denies any chest pain shortness of breath dizziness loss of consciousness nausea vomiting abdominal pain diarrhea fever chills headache.  Review of Systems: As presented in the history of presenting illness nothing else significant.  Past Medical History  Diagnosis Date  . Anxiety    Past Surgical History  Procedure Laterality Date  . Abdominal hysterectomy    . Cholecystectomy    . Appendectomy    . Colon surgery    . Cardiac catheterization     Social History:  reports that she has been smoking Cigarettes.  She has been smoking about 0.00 packs per day. She does not have any smokeless tobacco history on file. She reports that  drinks alcohol. She reports that she does not use illicit drugs. Lives at home. where does patient live--home, ALF, SNF? and with whom if at home? Can do  ADLs. Can patient participate in ADLs?  Allergies  Allergen Reactions  . Codeine     unk  . Demerol (Meperidine)     unk  . Doxycycline     unk  . Effexor (Venlafaxine Hcl)     unk  . Pneumovax (Pneumococcal Polysaccharides)     unk  . Sulfa Antibiotics     unk    Family History  Problem Relation Age of Onset  . COPD Father   . Stroke Father   . Breast cancer Sister       Prior to Admission medications   Medication Sig Start Date End Date Taking? Authorizing Caitlin Galloway  aspirin 325 MG tablet Take 162.5 mg by mouth daily.   Yes Historical Caitlin Pellegrino, MD  clonazePAM (KLONOPIN) 1 MG tablet Take 1 mg by mouth 2 (two) times daily as needed for anxiety.    Yes Historical Caitlin Mahurin, MD  nortriptyline (PAMELOR) 25 MG capsule Take 25 mg by mouth at bedtime.   Yes Historical Caitlin Pharris, MD  sertraline (ZOLOFT) 25 MG tablet Take 75 mg by mouth daily.   Yes Historical Caitlin Olden, MD   Physical Exam: Filed Vitals:   05/05/12 2000 05/05/12 2015 05/05/12 2045 05/05/12 2115  BP: 167/69 163/65 185/85 161/64  Pulse: 70 59 67 72  Temp:    98 F (36.7 C)  TempSrc:      Resp: 19 15 18 18   SpO2: 95% 97% 95% 97%     General:  Well-developed well-nourished.  Eyes: Anicteric no pallor.  ENT: No discharge from ears eyes nose and mouth.  Neck: No mass  felt.  Cardiovascular: S1-S2 heard.  Respiratory: No rhonchi or crepitations.  Abdomen: Soft nontender bowel sounds present.  Skin: No rash.  Musculoskeletal: No effusion or edema.  Psychiatric: Appears normal.  Neurologic: Alert awake oriented to time place and person. No facial asymmetry. Tongue is midline. Moves all extremities 5 x 5.  Labs on Admission:  Basic Metabolic Panel:  Recent Labs Lab 05/05/12 1824  NA 137  K 3.6  CL 100  CO2 27  GLUCOSE 97  BUN 9  CREATININE 0.74  CALCIUM 9.5   Liver Function Tests:  Recent Labs Lab 05/05/12 1824  AST 25  ALT 22  ALKPHOS 124*  BILITOT 0.2*  PROT 7.2  ALBUMIN 3.6    No results found for this basename: LIPASE, AMYLASE,  in the last 168 hours No results found for this basename: AMMONIA,  in the last 168 hours CBC:  Recent Labs Lab 05/05/12 1824  WBC 6.5  HGB 16.2*  HCT 44.1  MCV 90.9  PLT 219   Cardiac Enzymes:  Recent Labs Lab 05/05/12 1829  TROPONINI <0.30    BNP (last 3 results) No results found for this basename: PROBNP,  in the last 8760 hours CBG:  Recent Labs Lab 05/05/12 1754 05/05/12 2031  GLUCAP 161* 99    Radiological Exams on Admission: Ct Head Wo Contrast  05/05/2012  *RADIOLOGY REPORT*  Clinical Data: Left arm weakness beginning today.  CT HEAD WITHOUT CONTRAST  Technique:  Contiguous axial images were obtained from the base of the skull through the vertex without contrast.  Comparison: Head CT scan 03/23/2003.  Findings: Patchy and confluent hypoattenuation in the subcortical and periventricular deep white matter is markedly progressive since the prior study and consistent with microvascular ischemic change. No evidence of acute intracranial abnormality including infarction, hemorrhage, mass lesion, mass effect, midline shift or abnormal extra-axial fluid collection is identified.  There is no hydrocephalus or pneumocephalus. Cavum septum pellucidum is noted. The calvarium is intact.  Imaged paranasal sinuses and mastoid air cells are clear.  IMPRESSION:  1.  No acute finding. 2.  Extensive chronic microvascular ischemic change.   Original Report Authenticated By: Holley Dexter, M.D.    Monitor shows sinus rhythm.  Assessment/Plan Principal Problem:   CVA (cerebral infarction) Active Problems:   Tobacco abuse   Bipolar disorder, unspecified   1. CVA versus TIA - MRI of the brain MRA of the brain carotid Doppler and 2-D echo has been ordered. Patient has been placed on Plavix as recommended by the neurologist. Neuro checks. Monitor shows sinus rhythm. 2. Tobacco abuse - strongly advised to quit  smoking. 3. Bipolar disorder - continue present medications.  Neurology was consulted.   Code Status: Full code. Family Communication: None at the bedside.  Disposition Plan: Admit to inpatient.    KAKRAKANDY,ARSHAD N. [facility] Pager [phone].  If 7PM-7AM, please contact night-coverage [email or web address] Password [email or web address] 05/05/2012, 10:10 PM

## 2012-05-05 NOTE — Consult Note (Signed)
Referring Physician: Patria Mane    Chief Complaint: Left sided weakness  HPI: Caitlin Galloway is an 67 y.o. female who reports that this afternoon while watching television had the onset of acute left upper extremity numbness and weakness.  Was having difficulty picking up a a glass and controlling the arm.  When she attempted to stand to use the bathroom noted numbness and weakness in the left leg as well that actually began with noticing numbness in the toes.  When she attempted to walk to the bathroom she fell and was unable to get herself up.  She did shuffle herself to the phone and contacted EMS. Patient was brought in for evaluation at that time.  Reports that while being evaluated in the ED her symptoms improved significantly but is not sure that she is back to baseline.    LSN: [id number] Given: No: Rapid improvement in symptoms  Past Medical History  Diagnosis Date  . Anxiety     -  BPD  Past Surgical History  Procedure Laterality Date  . Abdominal hysterectomy    . Cholecystectomy    . Appendectomy    . Colon surgery      Family history:  Father is deceased.  Had BPD and multiple strokes.  Mother is alive with dementia.  Has 5 siblings.  One has had breast cancer s/p bilateral mastectomy.  One sister with thyroid disease.  One brother with kidney stones.  Remaining are alive and well.    Social History:  reports that she has been smoking Cigarettes.  She has been smoking about 0.00 packs per day. She does not have any smokeless tobacco history on file. She reports that  drinks alcohol. She reports that she does not use illicit drugs.  Allergies:  Allergies  Allergen Reactions  . Codeine     unk  . Demerol (Meperidine)     unk  . Doxycycline     unk  . Effexor (Venlafaxine Hcl)     unk  . Pneumovax (Pneumococcal Polysaccharides)     unk  . Sulfa Antibiotics     unk    Medications: I have reviewed the patient's current medications. Prior to Admission:  Current  outpatient prescriptions: aspirin 325 MG tablet, Take 162.5 mg by mouth daily., Disp: , Rfl: ;   clonazePAM (KLONOPIN) 1 MG tablet, Take 1 mg by mouth 2 (two) times daily as needed for anxiety. , Disp: , Rfl: ;  nortriptyline (PAMELOR) 25 MG capsule, Take 25 mg by mouth at bedtime., Disp: , Rfl: ;   sertraline (ZOLOFT) 25 MG tablet, Take 75 mg by mouth daily., Disp: , Rfl:   ROS: History obtained from the patient  General ROS: negative for - chills, fatigue, fever, night sweats, weight gain or weight loss Psychological ROS: negative for - behavioral disorder, hallucinations, memory difficulties, mood swings or suicidal ideation Ophthalmic ROS: negative for - blurry vision, double vision, eye pain or loss of vision ENT ROS: negative for - epistaxis, nasal discharge, oral lesions, sore throat, tinnitus or vertigo Allergy and Immunology ROS: negative for - hives or itchy/watery eyes Hematological and Lymphatic ROS: negative for - bleeding problems, bruising or swollen lymph nodes Endocrine ROS: negative for - galactorrhea, hair pattern changes, polydipsia/polyuria or temperature intolerance Respiratory ROS: negative for - cough, hemoptysis, shortness of breath or wheezing Cardiovascular ROS: negative for - chest pain, dyspnea on exertion, edema or irregular heartbeat Gastrointestinal ROS: negative for - abdominal pain, diarrhea, hematemesis, nausea/vomiting or stool incontinence  Genito-Urinary ROS: negative for - dysuria, hematuria, incontinence or urinary frequency/urgency Musculoskeletal ROS: negative for - joint swelling or muscular weakness Neurological ROS: as noted in HPI Dermatological ROS: negative for rash and skin lesion changes  Physical Examination: Blood pressure 163/65, pulse 59, temperature 98.6 F (37 C), temperature source Oral, resp. rate 15, SpO2 97.00%.  Neurologic Examination: Mental Status: Alert, oriented, thought content appropriate.  Speech fluent without evidence  of aphasia.  Able to follow 3 step commands without difficulty. Cranial Nerves: II: Discs flat bilaterally; Visual fields grossly normal, pupils equal, round, reactive to light and accommodation III,IV, VI: ptosis not present, extra-ocular motions intact bilaterally V,VII: smile symmetric, facial light touch sensation normal bilaterally VIII: hearing normal bilaterally IX,X: gag reflex present XI: bilateral shoulder shrug XII: midline tongue extension Motor: Right : Upper extremity   5/5    Left:     Upper extremity   5-/5  Lower extremity   5/5     Lower extremity   5/5 Tone and bulk:normal tone throughout; no atrophy noted Sensory: Pinprick and light touch intact throughout, bilaterally Deep Tendon Reflexes: 2+ and symmetric throughout Plantars: Right: downgoing   Left: downgoing Cerebellar: normal finger-to-nose and normal heel-to-shin test Gait: Unable to test CV: pulses palpable throughout   Laboratory Studies:  Basic Metabolic Panel:  Recent Labs Lab 05/05/12 1824  NA 137  K 3.6  CL 100  CO2 27  GLUCOSE 97  BUN 9  CREATININE 0.74  CALCIUM 9.5    Liver Function Tests:  Recent Labs Lab 05/05/12 1824  AST 25  ALT 22  ALKPHOS 124*  BILITOT 0.2*  PROT 7.2  ALBUMIN 3.6   No results found for this basename: LIPASE, AMYLASE,  in the last 168 hours No results found for this basename: AMMONIA,  in the last 168 hours  CBC:  Recent Labs Lab 05/05/12 1824  WBC 6.5  HGB 16.2*  HCT 44.1  MCV 90.9  PLT 219    Cardiac Enzymes:  Recent Labs Lab 05/05/12 1829  TROPONINI <0.30    BNP: No components found with this basename: POCBNP,   CBG:  Recent Labs Lab 05/05/12 1754 05/05/12 2031  GLUCAP 161* 99    Microbiology: No results found for this or any previous visit.  Coagulation Studies: No results found for this basename: LABPROT, INR,  in the last 72 hours  Urinalysis:  Recent Labs Lab 05/05/12 1859  COLORURINE YELLOW  LABSPEC 1.009   PHURINE 7.0  GLUCOSEU NEGATIVE  HGBUR NEGATIVE  BILIRUBINUR NEGATIVE  KETONESUR NEGATIVE  PROTEINUR NEGATIVE  UROBILINOGEN 0.2  NITRITE NEGATIVE  LEUKOCYTESUR SMALL*    Lipid Panel: No results found for this basename: chol, trig, hdl, cholhdl, vldl, ldlcalc    HgbA1C:  No results found for this basename: HGBA1C    Urine Drug Screen:     Component Value Date/Time   LABOPIA NONE DETECTED 05/05/2012 1859   COCAINSCRNUR NONE DETECTED 05/05/2012 1859   LABBENZ NONE DETECTED 05/05/2012 1859   AMPHETMU NONE DETECTED 05/05/2012 1859   THCU NONE DETECTED 05/05/2012 1859   LABBARB NONE DETECTED 05/05/2012 1859    Alcohol Level:  Recent Labs Lab 05/05/12 1824  ETH <11    Imaging: Ct Head Wo Contrast  05/05/2012  *RADIOLOGY REPORT*  Clinical Data: Left arm weakness beginning today.  CT HEAD WITHOUT CONTRAST  Technique:  Contiguous axial images were obtained from the base of the skull through the vertex without contrast.  Comparison: Head CT scan 03/23/2003.  Findings:  Patchy and confluent hypoattenuation in the subcortical and periventricular deep white matter is markedly progressive since the prior study and consistent with microvascular ischemic change. No evidence of acute intracranial abnormality including infarction, hemorrhage, mass lesion, mass effect, midline shift or abnormal extra-axial fluid collection is identified.  There is no hydrocephalus or pneumocephalus. Cavum septum pellucidum is noted. The calvarium is intact.  Imaged paranasal sinuses and mastoid air cells are clear.  IMPRESSION:  1.  No acute finding. 2.  Extensive chronic microvascular ischemic change.   Original Report Authenticated By: Holley Dexter, M.D.     Assessment: 67 y.o. female presenting with complaints of left sided numbness and weakness.  Symptoms have resolved with only some mild LLE weakness noted on examination at this time.  Patient now able to walk for transfers, etc.  CT unremarkable.  Patient with risk  factor of smoking.  On ASA at home.  Further work up recommended.    Stroke Risk Factors - smoking  Plan: 1. HgbA1c, fasting lipid panel 2. MRI, MRA  of the brain without contrast 3. PT consult, OT consult 4. Echocardiogram 5. Carotid dopplers 6. Prophylactic therapy-Antiplatelet med: Plavix - dose 75mg  daily 7. Risk factor modification 8. Telemetry monitoring 9. Frequent neuro checks    Thana Farr, MD [facility] [phone] 05/05/2012, 8:53 PM

## 2012-05-05 NOTE — ED Notes (Signed)
Neurologist at bedside. 

## 2012-05-05 NOTE — Research (Signed)
Patient was admitted to the hospital for possible Stroke or TIA. During evaluation, patient was identified as a potential POINT trial candidate. The POINT informed consent form was given to patient to read. Patient was given time to read the informed consent and ask questions. No study related procedures or test were performed prior to the consenting process. Patient agreed to participate in the POINT trial and signatures were obtained. A copy of the signed consent was given to patient for personal record. Patient met the inclusion/exclusion criteria and was randomized to kit# [id number]. Patient was given 8 tabs of study drug at 23:43 by research nurse.

## 2012-05-06 ENCOUNTER — Encounter (HOSPITAL_COMMUNITY): Payer: Self-pay | Admitting: *Deleted

## 2012-05-06 ENCOUNTER — Inpatient Hospital Stay (HOSPITAL_COMMUNITY): Payer: Medicare Other

## 2012-05-06 DIAGNOSIS — E876 Hypokalemia: Secondary | ICD-10-CM | POA: Diagnosis not present

## 2012-05-06 DIAGNOSIS — G459 Transient cerebral ischemic attack, unspecified: Secondary | ICD-10-CM

## 2012-05-06 DIAGNOSIS — J441 Chronic obstructive pulmonary disease with (acute) exacerbation: Secondary | ICD-10-CM | POA: Diagnosis present

## 2012-05-06 DIAGNOSIS — I6350 Cerebral infarction due to unspecified occlusion or stenosis of unspecified cerebral artery: Secondary | ICD-10-CM

## 2012-05-06 HISTORY — DX: Transient cerebral ischemic attack, unspecified: G45.9

## 2012-05-06 LAB — COMPREHENSIVE METABOLIC PANEL
ALT: 19 U/L (ref 0–35)
AST: 21 U/L (ref 0–37)
Alkaline Phosphatase: 113 U/L (ref 39–117)
CO2: 27 mEq/L (ref 19–32)
Calcium: 9 mg/dL (ref 8.4–10.5)
Chloride: 103 mEq/L (ref 96–112)
GFR calc Af Amer: >90 mL/min (ref >90)
GFR calc non Af Amer: 86 mL/min — ABNORMAL LOW (ref >90)
Glucose, Bld: 86 mg/dL (ref 70–99)
Potassium: 3.3 mEq/L — ABNORMAL LOW (ref 3.5–5.1)
Total Bilirubin: 0.4 mg/dL (ref 0.3–1.2)

## 2012-05-06 LAB — LIPID PANEL
Cholesterol: 184 mg/dL (ref 0–200)
LDL Cholesterol: 99 mg/dL (ref 0–99)
Total CHOL/HDL Ratio: 2.7 RATIO
Triglycerides: 89 mg/dL (ref <150)
VLDL: 18 mg/dL (ref 0–40)

## 2012-05-06 LAB — CBC
MCH: 31.9 pg (ref 26.0–34.0)
MCHC: 35.6 g/dL (ref 30.0–36.0)
MCV: 89.5 fL (ref 78.0–100.0)
Platelets: 192 10*3/uL (ref 150–400)
RDW: 13.5 % (ref 11.5–15.5)

## 2012-05-06 LAB — HEMOGLOBIN A1C: Hgb A1c MFr Bld: 5.5 % (ref <5.7)

## 2012-05-06 MED ORDER — ATORVASTATIN CALCIUM 20 MG PO TABS: 20.0000 mg | ORAL_TABLET | Freq: Every day | ORAL | Status: AC

## 2012-05-06 MED ORDER — STUDY - INVESTIGATIONAL DRUG SIMPLE RECORD: 75.0000 mg | Freq: Every day | Status: DC

## 2012-05-06 MED ORDER — POTASSIUM CHLORIDE CRYS ER 20 MEQ PO TBCR
20.0000 meq | EXTENDED_RELEASE_TABLET | Freq: Two times a day (BID) | ORAL | Status: DC
  Administered 2012-05-06: 20 meq via ORAL
  Filled 2012-05-06 (×2): qty 1

## 2012-05-06 NOTE — Evaluation (Signed)
SLP reviewed and agree with student findings.   Leah McCoy MA, CCC-SLP [phone]    

## 2012-05-06 NOTE — Discharge Summary (Signed)
Physician Discharge Summary  NAME:Caitlin Galloway  ZOX:[id number]  DOB: [birth year]   Admit date: 05/05/2012 Discharge date: 05/06/2012  Admitting Diagnosis: stroke  Discharge Diagnoses:  Active Hospital Problems   Diagnosis Date Noted  . TIA (transient ischemic attack) 05/05/2012  . COPD exacerbation 05/06/2012  . Hypokalemia 05/06/2012  . Tobacco abuse 05/05/2012  . Bipolar disorder, unspecified 05/05/2012    Resolved Hospital Problems   Diagnosis Date Noted Date Resolved  No resolved problems to display.    Discharge Condition: improved  Hospital Course: Patient brought in for observation after developing left arm and leg weakness. Code stroke called but out of window for treatment. Symptoms have all resolved.   Workup including MRI, MRA, carotid doppler, and 2D echo showed only some mild vascular changes. No major obstruction of any major vessels. She was started on study drug. I added statin due to present of vascular changes despite good lipids. We discussed need to quit smoking and she was given several support programs and groups to contact.   Consults: neurology  Disposition:   Discharge Orders   Future Orders Complete By Expires     Diet - low sodium heart healthy  As directed     Discharge instructions  As directed     Comments:      See information about smoking cessation. This is really important!!!    Increase activity slowly  As directed         Medication List    STOP taking these medications       nortriptyline 25 MG capsule  Commonly known as:  PAMELOR      TAKE these medications       aspirin 325 MG tablet  Take 162.5 mg by mouth daily.     atorvastatin 20 MG tablet  Commonly known as:  LIPITOR  Take 1 tablet (20 mg total) by mouth daily.     clonazePAM 1 MG tablet  Commonly known as:  KLONOPIN  Take 1 mg by mouth 2 (two) times daily as needed for anxiety.     INVESTIGATIONAL DRUG SIMPLE RECORD  Take 75 mg by mouth daily with breakfast.      sertraline 25 MG tablet  Commonly known as:  ZOLOFT  Take 75 mg by mouth daily.           Follow-up Information   Follow up with Gates Rigg, MD. Schedule an appointment as soon as possible for a visit in 2 months. (stroke clinic)    Contact information:   [address], [address] [facility] [place] [address] [phone]       Follow up with Darnelle Bos, MD. (we will call you with an appointment in the next few days)    Contact information:   [address], [facility] AND [place] [address] [phone]       Things to follow up in the outpatient setting: recheck K. Check smoking status. Followup on atorvastatin  Time coordinating discharge: 35 minutes including medication reconciliation, transmission of prescriptions to pharmacy, preparation of discharge papers, and discussion with patient other providers (Dr. Pearlean Brownie, nursing staff)    Signed: Darnelle Bos 05/06/2012, 6:16 PM

## 2012-05-06 NOTE — Progress Notes (Signed)
Bilateral:  No evidence of hemodynamically significant internal carotid artery stenosis.   Vertebral artery flow is antegrade.     

## 2012-05-06 NOTE — Progress Notes (Signed)
Nursing Note: Alerted by co-worker that telmetry center  has called earlier about low heart rate but this is the first I've heard. Paged on-ca ll via computer r/t low heart rate.A; In to see pt and pt awakened easily and states that she had Gall Bladder surgery in 1978 and her heart rate  has dropped into the 40s when she sleeps since that surgery and that  her doctor is aware.Pt says it always does this when she sleeps and denies any sx. No pain or SOB.Dr. Shirlee Latch called back and I made her aware of current rate of 50 taken manually at the bedside . No New orders obtained and it is still ok for transport w/o monitor.Will pass on to next shift and leave sticky note for MD. bp- 186/68 PO2 94 % on r/a.wbb

## 2012-05-06 NOTE — Progress Notes (Signed)
Patient discharge teaching completed.  [facility] Stroke booklet given.  Point trial medications given to patient and IV removed.  Patient's brother picking her up.  We will monitor until he arrives.  Lance Bosch, RN

## 2012-05-06 NOTE — Progress Notes (Signed)
Patient currently getting MRI. Not sure if I will be able to see today. Stroke team following.   I d/c'ed her Pamelor as she refused it last night.   I have not seen the patient in many years. Apparently, according to our office notes, she has been on Combivent and Spiriva from one our nurse practitioners. I will confirm with patient later before ordering.

## 2012-05-06 NOTE — Evaluation (Signed)
Physical Therapy Evaluation Patient Details Name: LEYNA VANDERKOLK MRN: [id number] DOB: February 17, 1946 Today's Date: 05/06/2012 Time: 4098-1191 PT Time Calculation (min): 15 min  PT Assessment / Plan / Recommendation Clinical Impression  Pt 67 yo female who admitted for L sided weakness which has since resolved. Pt functioning near baseline and is safe for d/c home when medically stable. Pt with no further skilled PT needs at this time. PT signing off. Please re-consult if needed in future. Recommend out patient PT if patient doesn't feel she is 100% back to normal by the end of the week to address mild L sided weakness.    PT Assessment  Patent does not need any further PT services    Follow Up Recommendations  Outpatient PT    Does the patient have the potential to tolerate intense rehabilitation      Barriers to Discharge        Equipment Recommendations  None recommended by PT    Recommendations for Other Services     Frequency      Precautions / Restrictions Restrictions Weight Bearing Restrictions: No   Pertinent Vitals/Pain Pt denies pain at this time      Mobility  Bed Mobility Bed Mobility: Supine to Sit;Sit to Supine Supine to Sit: 7: Independent;HOB flat Sit to Supine: 7: Independent;HOB flat Details for Bed Mobility Assistance: safe technique Transfers Transfers: Sit to Stand;Stand to Sit Sit to Stand: 6: Modified independent (Device/Increase time);With upper extremity assist;From bed Stand to Sit: 6: Modified independent (Device/Increase time);With upper extremity assist;To bed Details for Transfer Assistance: safe technique Ambulation/Gait Ambulation/Gait Assistance: 6: Modified independent (Device/Increase time) Ambulation Distance (Feet): 200 Feet Assistive device: None Ambulation/Gait Assistance Details: no LOB, slightly slower pace per pt report Gait Pattern: Within Functional Limits Gait velocity: slight decrease in pace per pt report Stairs:  Yes Stair Management Technique: No rails Number of Stairs: 2 Modified Rankin (Stroke Patients Only) Pre-Morbid Rankin Score: No symptoms Modified Rankin: No significant disability    Exercises     PT Diagnosis:    PT Problem List:   PT Treatment Interventions:     PT Goals Acute Rehab PT Goals PT Goal Formulation:  (n/a)  Visit Information  Last PT Received On: 05/06/12 Assistance Needed: +1    Subjective Data  Subjective: Pt received supine in bed with report "I'm almost back to normal." Patient Stated Goal: home today   Prior Functioning  Home Living Lives With: Alone Available Help at Discharge: Family;Available 24 hours/day Type of Home: House Home Access: Stairs to enter [employer] of Steps: 2 Entrance Stairs-Rails: None Home Layout: One level Bathroom Shower/Tub: Health visitor: Standard Bathroom Accessibility: Yes How Accessible: Accessible via walker Home Adaptive Equipment: None Prior Function Level of Independence: Independent Able to Take Stairs?: Yes Driving: Yes Vocation: Retired Musician: No difficulties Dominant Hand: Right    Cognition  Cognition Overall Cognitive Status: Appears within functional limits for tasks assessed/performed Arousal/Alertness: Awake/alert Orientation Level: Oriented X4 / Intact Behavior During Session: WFL for tasks performed    Extremity/Trunk Assessment Right Upper Extremity Assessment RUE ROM/Strength/Tone: Within functional levels RUE Coordination: WFL - gross/fine motor Left Upper Extremity Assessment LUE ROM/Strength/Tone: WFL for tasks assessed (grossly 5-/5) LUE Sensation: WFL - Light Touch LUE Coordination: WFL - gross/fine motor Right Lower Extremity Assessment RLE ROM/Strength/Tone: Within functional levels RLE Sensation: WFL - Light Touch RLE Coordination: WFL - gross/fine motor Left Lower Extremity Assessment LLE ROM/Strength/Tone: WFL for tasks  assessed (grossly 5-/5) LLE  Sensation: WFL - Light Touch LLE Coordination: WFL - gross/fine motor Trunk Assessment Trunk Assessment: Normal   Balance Balance Balance Assessed: Yes Static Sitting Balance Static Sitting - Balance Support: No upper extremity supported Static Sitting - Level of Assistance: 7: Independent Static Sitting - Comment/# of Minutes: 5 Dynamic Standing Balance Dynamic Standing - Balance Support: No upper extremity supported Dynamic Standing - Level of Assistance: 7: Independent  End of Session PT - End of Session Equipment Utilized During Treatment: Gait belt Activity Tolerance: Patient tolerated treatment well Patient left: in bed;with call bell/phone within reach;with bed alarm set Nurse Communication: Mobility status  GP Functional Assessment Tool Used: clinical judgment Functional Limitation: Mobility: Walking and moving around Mobility: Walking and Moving Around Current Status (Z6109): At least 1 percent but less than 20 percent impaired, limited or restricted Mobility: Walking and Moving Around Goal Status [phone]): At least 1 percent but less than 20 percent impaired, limited or restricted   Marcene Brawn 05/06/2012, 3:19 PM

## 2012-05-06 NOTE — Evaluation (Signed)
Speech Language Pathology Evaluation Patient Details Name: Caitlin Galloway MRN: [id number] DOB: 1945-07-11 Today's Date: 05/06/2012 Time: 8469-6295 SLP Time Calculation (min): 15 min  Problem List:  Patient Active Problem List  Diagnosis  . CVA (cerebral infarction)  . Tobacco abuse  . Bipolar disorder, unspecified   Past Medical History:  Past Medical History  Diagnosis Date  . Anxiety   . TIA (transient ischemic attack) 05/06/2012  . COPD (chronic obstructive pulmonary disease)     " mild "  . Shortness of breath   . GERD (gastroesophageal reflux disease)   . Mental disorder     bipolar  . Depression    Past Surgical History:  Past Surgical History  Procedure Laterality Date  . Abdominal hysterectomy    . Cholecystectomy    . Appendectomy    . Colon surgery    . Cardiac catheterization     HPI:  Caitlin Galloway is a 67 y.o. female with history of bipolar disorder and ongoing tobacco abuse started experiencing left-sided weakness at her home 3/4. The weakness started around 4 PM.The symptoms were associated with numbness and tingling of the upper left extremities. Denied any difficulty swallowing, speaking or visual symptoms. Patient states that her CBG was around 69 and her weakness improved after she ate something. CT head was negative for anything acute. Patient has been evaluated by the neurologist and has been admitted for further management. MRI on 3/4 revealed no acute infarct, MRA 3/4revealed branch vessel atherosclerotic type changes.   Assessment / Plan / Recommendation Clinical Impression  Patient presents with functional cognitive-linguistic skills. Minimal short term memory deficits noted however patient reports this is baseline and currently compensates independently without interference with ADLs.  Oral motor exam WFL and patient neither reports or presents with any  speech/language difficulties this admission. Judgement and reasoning WFL. Education complete. No  f/u SLP services needed at this time.     SLP Assessment  Patient does not need any further Speech Lanaguage Pathology Services    Follow Up Recommendations  None    Frequency and Duration        Pertinent Vitals/Pain None reported   SLP Goals     SLP Evaluation Prior Functioning  Cognitive/Linguistic Baseline: Within functional limits   Cognition  Overall Cognitive Status: Appears within functional limits for tasks assessed Arousal/Alertness: Awake/alert Orientation Level: Oriented X4 Memory: Impaired Memory Impairment: Decreased short term memory Decreased Short Term Memory: Verbal basic Awareness: Appears intact Problem Solving: Appears intact Safety/Judgment: Appears intact    Comprehension  Auditory Comprehension Overall Auditory Comprehension: Appears within functional limits for tasks assessed Yes/No Questions: Within Functional Limits Commands: Within Functional Limits Visual Recognition/Discrimination Discrimination: Not tested Reading Comprehension Reading Status: Not tested    Expression Expression Primary Mode of Expression: Verbal Verbal Expression Overall Verbal Expression: Appears within functional limits for tasks assessed Level of Generative/Spontaneous Verbalization: Conversation Repetition: No impairment Naming: No impairment Pragmatics: No impairment Written Expression Written Expression: Not tested   Oral / Motor Oral Motor/Sensory Function Overall Oral Motor/Sensory Function: Appears within functional limits for tasks assessed Labial ROM: Within Functional Limits Labial Symmetry: Within Functional Limits Labial Strength: Within Functional Limits Labial Sensation: Within Functional Limits Lingual ROM: Within Functional Limits Lingual Symmetry: Within Functional Limits Lingual Strength: Within Functional Limits Lingual Sensation: Within Functional Limits Facial ROM: Within Functional Limits Facial Symmetry: Within Functional  Limits Facial Strength: Within Functional Limits Facial Sensation: Within Functional Limits Velum: Within Functional Limits Mandible: Within Functional Limits Motor  Speech Overall Motor Speech: Appears within functional limits for tasks assessed Articulation: Within functional limitis Intelligibility: Intelligible Motor Planning: Witnin functional limits   GO   Berdine Dance SLP student   Berdine Dance 05/06/2012, 1:21 PM

## 2012-05-06 NOTE — Progress Notes (Signed)
Patient's K was 3.3.  Spoke with Dr. Earl Gala who is putting orders in to replace K.  Will continue to monitor.  SMYTHE, Nelva Bush, RN

## 2012-05-06 NOTE — Progress Notes (Signed)
  Echocardiogram 2D Echocardiogram has been performed.  Ellender Hose A 05/06/2012, 8:44 AM

## 2012-05-06 NOTE — Progress Notes (Signed)
Nursing Note: Pt arrived via bed. Alert and oriented x4 .Pleasant and cooperative. Denies any pain or distress.MAE well.Neuro-check completely normal.T-98.0 P-72 R-18 BP 161/64 PO2 97% on r/a.Pt oriented to room and resting quietly in bed.wbb

## 2012-05-06 NOTE — Progress Notes (Signed)
Stroke Team Progress Note  HISTORY Caitlin Galloway is an 67 y.o. female who reports that this afternoon 05/05/2012 while watching television had the onset of acute left upper extremity numbness and weakness. Was having difficulty picking up a a glass and controlling the arm. When she attempted to stand to use the bathroom noted numbness and weakness in the left leg as well that actually began with noticing numbness in the toes. When she attempted to walk to the bathroom she fell and was unable to get herself up. She did shuffle herself to the phone and contacted EMS. Patient was brought in for evaluation at that time. Reports that while being evaluated in the ED her symptoms improved significantly but is not sure that she is back to baseline.  Patient was not a TPA candidate secondary to rapid improvement in symptoms. She was admitted for further evaluation and treatment.  SUBJECTIVE Her NT is at the bedside.  Overall she feels her condition is gradually improving - really back to baseline.  OBJECTIVE Most recent Vital Signs: Filed Vitals:   05/06/12 0300 05/06/12 0500 05/06/12 0641 05/06/12 0933  BP: 144/51 143/56 186/68 160/90  Pulse: 51 56 50 51  Temp:  97.6 F (36.4 C)  97.7 F (36.5 C)  TempSrc:  Oral  Oral  Resp: 18 18 18 17   Weight:      SpO2: 94% 97% 94% 98%   CBG (last 3)   Recent Labs  05/05/12 1754 05/05/12 2031 05/05/12 2248  GLUCAP 161* 99 141*    IV Fluid Intake:   . sodium chloride 75 mL/hr at 05/06/12 0056    MEDICATIONS  . clopidogrel  75 mg Oral Q breakfast  . Point Trial - clopidogrel / placebo (daily dose)  (PI-Sethi)  75 mg Oral Q breakfast  . sertraline  75 mg Oral Daily   PRN:  acetaminophen, clonazePAM, senna-docusate  Diet:  Cardiac thin liquids Activity:  OOB with assistance, up as tolerated DVT Prophylaxis:  SCDs   CLINICALLY SIGNIFICANT STUDIES Basic Metabolic Panel:  Recent Labs Lab 05/05/12 1824 05/06/12 0615  NA 137 140  K 3.6 3.3*  CL  100 103  CO2 27 27  GLUCOSE 97 86  BUN 9 6  CREATININE 0.74 0.78  CALCIUM 9.5 9.0   Liver Function Tests:  Recent Labs Lab 05/05/12 1824 05/06/12 0615  AST 25 21  ALT 22 19  ALKPHOS 124* 113  BILITOT 0.2* 0.4  PROT 7.2 6.5  ALBUMIN 3.6 3.2*   CBC:  Recent Labs Lab 05/05/12 1824 05/06/12 0615  WBC 6.5 4.0  HGB 16.2* 14.9  HCT 44.1 41.8  MCV 90.9 89.5  PLT 219 192   Coagulation:  Recent Labs Lab 05/05/12 2213  LABPROT 11.9  INR 0.88   Cardiac Enzymes:  Recent Labs Lab 05/05/12 1829  TROPONINI <0.30   Urinalysis:  Recent Labs Lab 05/05/12 1859  COLORURINE YELLOW  LABSPEC 1.009  PHURINE 7.0  GLUCOSEU NEGATIVE  HGBUR NEGATIVE  BILIRUBINUR NEGATIVE  KETONESUR NEGATIVE  PROTEINUR NEGATIVE  UROBILINOGEN 0.2  NITRITE NEGATIVE  LEUKOCYTESUR SMALL*   Lipid Panel    Component Value Date/Time   CHOL 184 05/06/2012 0615   TRIG 89 05/06/2012 0615   HDL 67 05/06/2012 0615   CHOLHDL 2.7 05/06/2012 0615   VLDL 18 05/06/2012 0615   LDLCALC 99 05/06/2012 0615   HgbA1C  Lab Results  Component Value Date   HGBA1C 5.5 05/06/2012    Urine Drug Screen:  Component Value Date/Time   LABOPIA NONE DETECTED 05/05/2012 1859   COCAINSCRNUR NONE DETECTED 05/05/2012 1859   LABBENZ NONE DETECTED 05/05/2012 1859   AMPHETMU NONE DETECTED 05/05/2012 1859   THCU NONE DETECTED 05/05/2012 1859   LABBARB NONE DETECTED 05/05/2012 1859    Alcohol Level:  Recent Labs Lab 05/05/12 1824  ETH <11   CT of the brain  05/05/2012    1.  No acute finding. 2.  Extensive chronic microvascular ischemic change.   MRI of the brain  05/06/2012    No acute infarct.  Moderate small vessel disease type changes.  Global atrophy without hydrocephalus.    MRA of the brain  05/06/2012   Branch vessel atherosclerotic type changes.   2D Echocardiogram    Carotid Doppler  No evidence of hemodynamically significant internal carotid artery stenosis. Vertebral artery flow is antegrade.   CXR  05/06/2012  Chronic  bronchitic change and mild scarring atelectasis.  No evidence of pulmonary edema or pulmonary infection.     EKG  normal sinus rhythm.   Therapy Recommendations   Physical Exam   Pleasant elderly Caucasian lady not in distress.Awake alert. Afebrile. Head is nontraumatic. Neck is supple without bruit. Hearing is normal. Cardiac exam no murmur or gallop. Lungs are clear to auscultation. Distal pulses are well felt. Neurological Exam : Awake  Alert oriented x 3. Normal speech and language.eye movements full without nystagmus. Face symmetric. Tongue midline. Normal strength, tone, reflexes and coordination. Normal sensation. Gait deferred. ASSESSMENT Ms. Caitlin Galloway is a 67 y.o. female presenting with left upper extremity numbness and weakness. Imaging confirms no acute infarct. Dx: right brain TIA  On aspirin 325 mg orally every day prior to admission. Now on aspirin 325 mg orally every day and and POINT study drug for secondary stroke prevention. Patient with no resultant neuro deficits. Work up underway.   Anxiety  Cigarette smoker  etoh use  LDL 99  HgbA1c 5.5  Hospital day # 1  TREATMENT/PLAN  Continue aspirin 325 mg orally every day and POINT study drug for secondary stroke prevention.  F/u 2D echo Patient is enrollled in POINT Trial - POINT is a randomized, double-blind, multicenter clinical trial to determine whether clopidogrel 75mg /day (after a loading dose of 600mg ) is effective in improving survival free from major ischemic vascular events (ischemic stroke, myocardial infarction, and ischemic vascular death) at 90 days when initiated within 12 hours time last known free of new ischemic symptoms of TIA or minor ischemic stroke in subjects receiving aspirin 50-325mg /day. Please contact Lorenza Burton at [facility] at [phone] for any questions. Ok for discharge today from neuro standpoint Order written to dispense POINT study drug at time of  discharge Follow up Dr. Pearlean Brownie in 2 months.  Annie Main, MSN, RN, ANVP-BC, ANP-BC, [facility] Pager: [phone] 05/06/2012 11:18 AM  I have personally obtained a history, examined the patient, evaluated imaging results, and formulated the assessment and plan of care. I agree with the above.  Delia Heady, MD

## 2012-05-06 NOTE — Progress Notes (Signed)
Patient heart rate constantly dropping into the high 30's-low 40's.  Patient is also having short (<3 sec) pauses.  She is asymptomatic and says that she has a history of this.  I have paged stroke NP, Annie Main at [phone] and am awaiting instructions.  Will continue to monitor.  Lance Bosch RN

## 2012-05-07 NOTE — Care Management Note (Signed)
    Page 1 of 1   05/07/2012     8:06:05 AM   CARE MANAGEMENT NOTE 05/07/2012  Patient:  [facility]   Account Number:  [id number]  Date Initiated:  05/06/2012  Documentation initiated by:  [facility]  Subjective/Objective Assessment:   admitted with left sided weakness, CVA/TIA workup     Action/Plan:   PT eval   Anticipated DC Date:  05/07/2012   Anticipated DC Plan:  HOME/SELF CARE      DC Planning Services  CM consult      Choice offered to / List presented to:             Status of service:  Completed, signed off Medicare Important Message given?   (If response is "NO", the following Medicare IM given date fields will be blank) Date Medicare IM given:   Date Additional Medicare IM given:    Discharge Disposition:  HOME/SELF CARE  Per UR Regulation:  Reviewed for med. necessity/level of care/duration of stay  If discussed at Long Length of Stay Meetings, dates discussed:    Comments:

## 2012-08-06 ENCOUNTER — Encounter: Payer: Self-pay | Admitting: Neurology

## 2012-08-13 ENCOUNTER — Ambulatory Visit (INDEPENDENT_AMBULATORY_CARE_PROVIDER_SITE_OTHER): Payer: Medicare Other | Admitting: Neurology

## 2012-08-13 VITALS — BP 139/89 | Wt 174.0 lb

## 2012-08-13 DIAGNOSIS — G459 Transient cerebral ischemic attack, unspecified: Secondary | ICD-10-CM

## 2012-08-14 NOTE — Progress Notes (Signed)
Caitlin Galloway is here for 90 day follow up.  Completed study drug 08/02/12 wiith 0 tablets remaining.  Modified Rankin score of 0 assessed  by Kirk Ruths and, CRC, NIHSS of 0, assessed by Dr. Pearlean Brownie.  [place] Questionnare answered with no to all questions.  Denies any recurrent S/S of TIA or CVA.

## 2012-09-03 ENCOUNTER — Ambulatory Visit: Payer: Self-pay | Admitting: Neurology

## 2012-10-07 ENCOUNTER — Other Ambulatory Visit: Payer: Self-pay

## 2012-11-25 ENCOUNTER — Other Ambulatory Visit: Payer: Self-pay

## 2012-11-25 DIAGNOSIS — Z1231 Encounter for screening mammogram for malignant neoplasm of breast: Secondary | ICD-10-CM

## 2012-12-07 ENCOUNTER — Ambulatory Visit (INDEPENDENT_AMBULATORY_CARE_PROVIDER_SITE_OTHER): Payer: Medicare Other | Admitting: Neurology

## 2012-12-07 ENCOUNTER — Encounter: Payer: Self-pay | Admitting: Neurology

## 2012-12-07 VITALS — BP 157/74 | HR 74 | Ht 67.0 in | Wt 178.5 lb

## 2012-12-07 DIAGNOSIS — G459 Transient cerebral ischemic attack, unspecified: Secondary | ICD-10-CM

## 2012-12-07 NOTE — Patient Instructions (Addendum)
Continue Plavix for stroke prevention and have counseled the patient to quit smoking completely. Return for followup in 6 months with Larita Fife, NP or. call earlier if necessary.

## 2012-12-08 NOTE — Progress Notes (Signed)
[facility] [address] [place]. [place] [address] [phone]       OFFICE FOLLOW-UP NOTE  Caitlin. Caitlin Galloway Date of Birth:  [birth year] Medical Record Number:  [id number]   HPI: Caitlin Galloway is a 44 year Caucasian lady who is seen for followup following hospital consultation for TIA on 05/05/12. She developed sudden onset of left upper extremity numbness and weakness while watching TV at home. She noticed difficulty while picking up a glass in controlling her left arm. When she got up and tried to walk to the bathroom she noticed that the left leg was affected as well. She called EMS and started improving by the time she got to the hospital. CT scan of the head as well as MRI scans the brain did not show any acute infarct. MRA of the brain and carotid Dopplers did not reveal any large vessel stenosis. Lipid profile showed total cholesterol 184, triglycerides 89, HDL 67 and LDL 99 mg percent. HbA1c  was 5.5 and urine drug screen was negative. She was started on aspirin for secondary stroke prevention and she met inclusion exclusion criteria and enrolled in the POINT trial. She completed the trial successfully at 90 days and was switched from aspirin to Plavix. She states she's done well and has not had any recurrent symptoms. She has cut back smoking significantly but still smokes less than half pack per day and this time to quit completely. She states her blood pressure is well controlled and is usually in the 120s though it is elevated at 157/79 office today. She is tolerating  Plavix well without bleeding, bruising or other side effects.  ROS:   14 system review of systems is positive for weight gain, leg swelling, shortness of breath, cough, allergies, anxiety, depression, memory loss, insomnia, not enough sleep and disinterest in activities. PMH:  Past Medical History  Diagnosis Date  . Anxiety   . TIA (transient ischemic attack) 05/06/2012  . COPD (chronic obstructive pulmonary  disease)     " mild "  . Shortness of breath   . GERD (gastroesophageal reflux disease)   . Mental disorder     bipolar  . Depression     Social History:  History   Social History  . Marital Status: Single    Spouse Name: N/A    Number of Children: 0  . Years of Education: RN   Occupational History  . Retired     Charity fundraiser   Social History Main Topics  . Smoking status: Current Every Day Smoker -- 2.00 packs/day for 45 years    Types: Cigarettes  . Smokeless tobacco: Never Used  . Alcohol Use: 0.6 oz/week    1 Glasses of wine per week     Comment: occassional   . Drug Use: No  . Sexual Activity: Not on file   Other Topics Concern  . Not on file   Social History Narrative   Patient lives at home alone.   Caffeine use: 1-2 sodas daily    Medications:   Current Outpatient Prescriptions on File Prior to Visit  Medication Sig Dispense Refill  . atorvastatin (LIPITOR) 20 MG tablet Take 1 tablet (20 mg total) by mouth daily.      . clonazePAM (KLONOPIN) 1 MG tablet Take 1 mg by mouth 2 (two) times daily as needed for anxiety.        No current facility-administered medications on file prior to visit.    Allergies:   Allergies  Allergen Reactions  . Codeine     unk  . Demerol [Meperidine]     unk  . Doxycycline     unk  . Effexor [Venlafaxine Hcl]     unk  . Pneumovax [Pneumococcal Polysaccharides]     unk  . Sulfa Antibiotics     unk    Physical Exam General: well developed, well nourished, seated, in no evident distress Head: head normocephalic and atraumatic. Orohparynx benign Neck: supple with no carotid or supraclavicular bruits Cardiovascular: regular rate and rhythm, no murmurs Musculoskeletal: no deformity Skin:  no rash/petichiae Vascular:  Normal pulses all extremities Filed Vitals:   12/07/12 1521  BP: 157/74  Pulse: 74    Neurologic Exam Mental Status: Awake and fully alert. Oriented to place and time. Recent and remote memory intact.  Attention span, concentration and fund of knowledge appropriate. Mood and affect appropriate.  Cranial Nerves: Fundoscopic exam reveals sharp disc margins. Pupils equal, briskly reactive to light. Extraocular movements full without nystagmus. Visual fields full to confrontation. Hearing intact. Facial sensation intact. Face, tongue, palate moves normally and symmetrically.  Motor: Normal bulk and tone. Normal strength in all tested extremity muscles. Sensory.: intact to touch and pinprick and vibratory.  Coordination: Rapid alternating movements normal in all extremities. Finger-to-nose and heel-to-shin performed accurately bilaterally. Gait and Station: Arises from chair without difficulty. Stance is normal. Gait demonstrates normal stride length and balance . Able to heel, toe and tandem walk without difficulty.  Reflexes: 1+ and symmetric. Toes downgoing.   NIHSS  0 Modified Rankin  0  ASSESSMENT: 55 year lady with right brain TIA in March 2014 secondary to small vessel disease with vascular risk factors of hyperlipidemia, smoking and alcohol use only    PLAN: Continue Plavix for stroke prevention and have counseled the patient to quit smoking completely and limit alcohol intake to  1 glass of wine per day. Return for followup in 6 months with Larita Fife, NP or. call earlier if necessary.

## 2012-12-09 ENCOUNTER — Ambulatory Visit
Admission: RE | Admit: 2012-12-09 | Discharge: 2012-12-09 | Disposition: A | Discharge status: Home / Self Care | Payer: Medicare Other | Source: Ambulatory Visit

## 2012-12-09 DIAGNOSIS — Z1231 Encounter for screening mammogram for malignant neoplasm of breast: Secondary | ICD-10-CM

## 2012-12-15 ENCOUNTER — Other Ambulatory Visit: Payer: Self-pay | Admitting: Internal Medicine

## 2012-12-15 DIAGNOSIS — R928 Other abnormal and inconclusive findings on diagnostic imaging of breast: Secondary | ICD-10-CM

## 2012-12-29 ENCOUNTER — Other Ambulatory Visit: Payer: Medicare Other

## 2013-01-07 ENCOUNTER — Other Ambulatory Visit: Payer: Self-pay

## 2013-01-08 ENCOUNTER — Other Ambulatory Visit: Payer: Medicare Other

## 2013-01-19 IMAGING — CR DG WRIST COMPLETE 3+V*L*
2 series · 2 of 2 positions shown · non-contrast
Comparison: None.

CLINICAL DATA: Left wrist pain.

LEFT WRIST - COMPLETE 3+ VIEW

[PA]
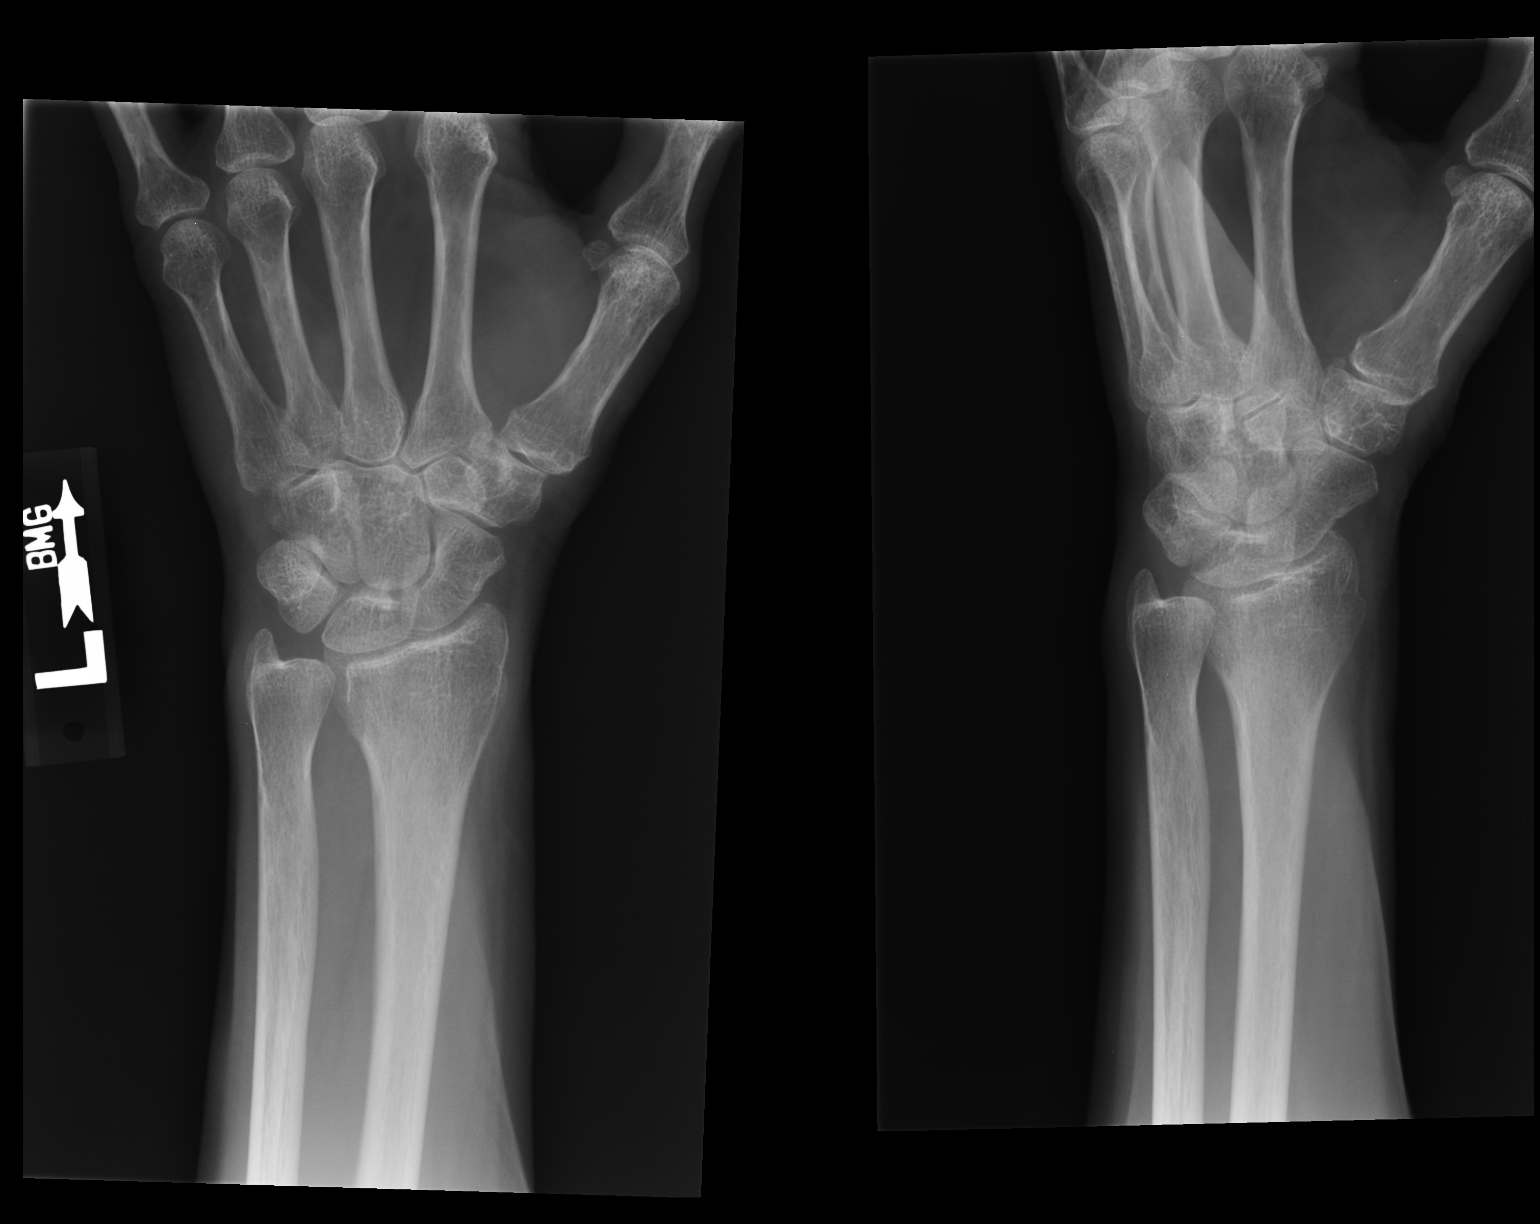

[lateral]
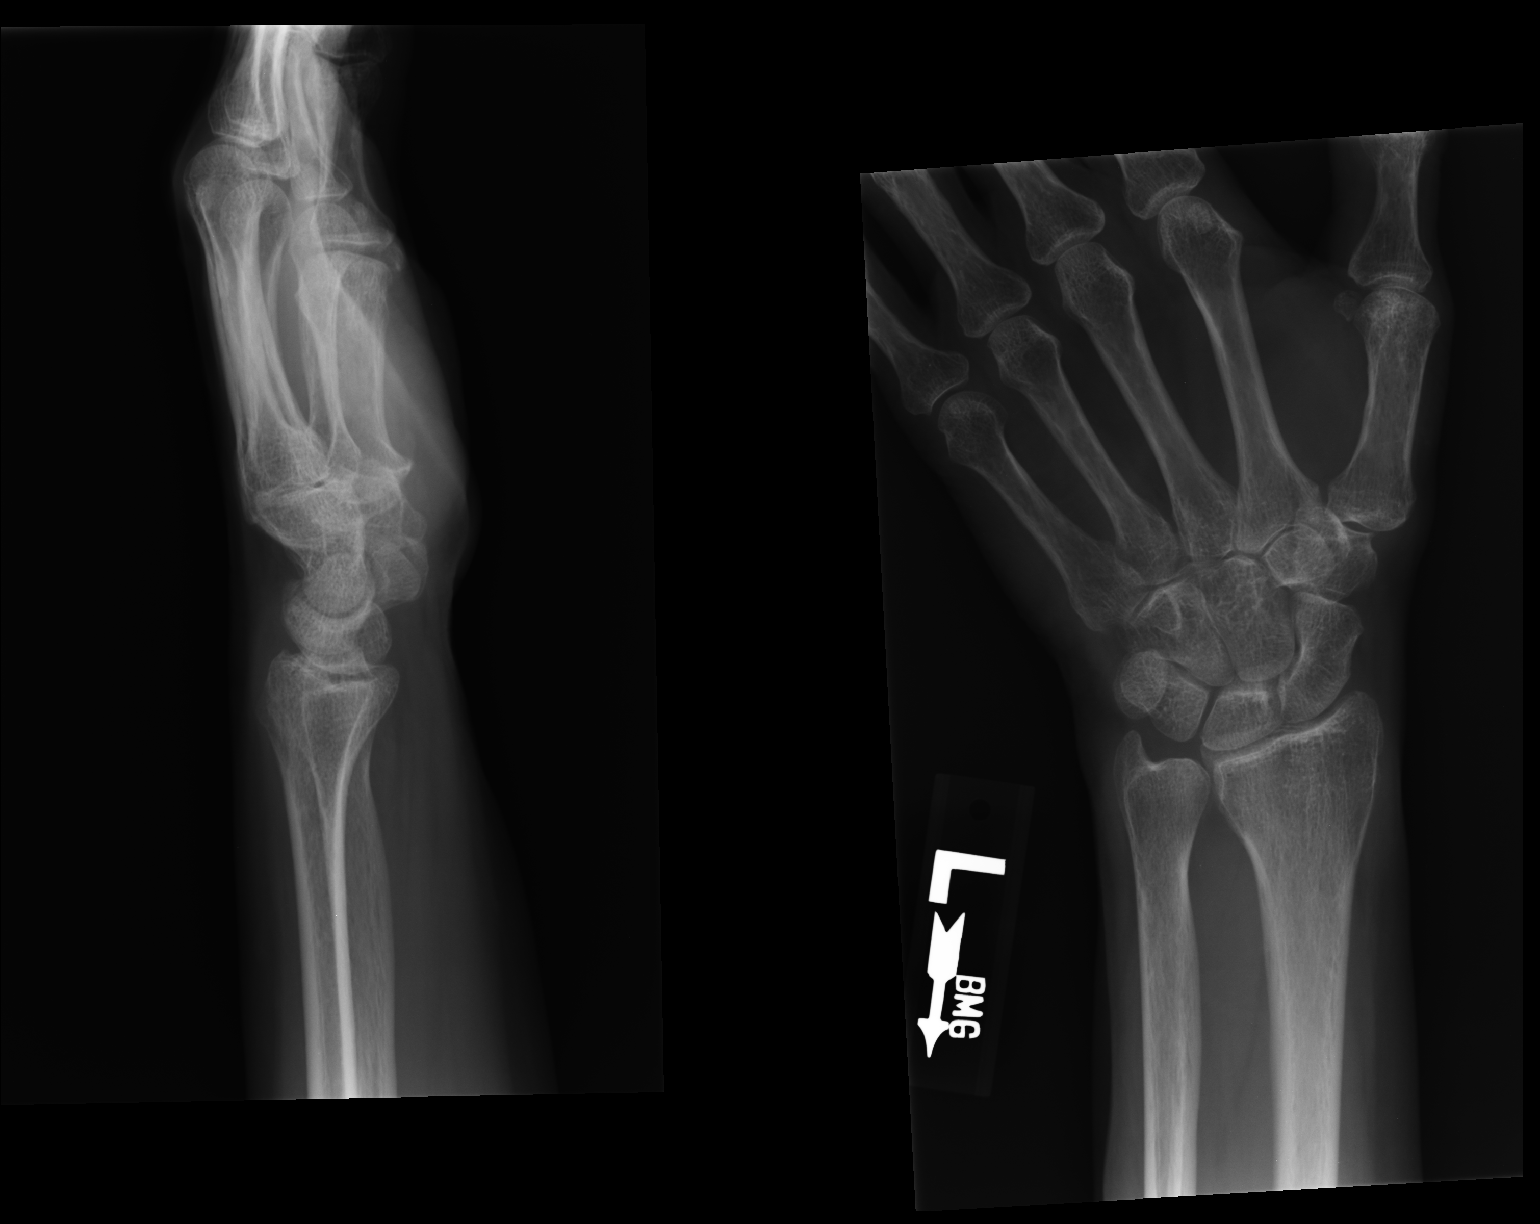

[2 of 2 positions shown; findings below may reference images not displayed]

FINDINGS: Imaged bones, joints and soft tissues appear normal.
IMPRESSION: Negative exam.

Clinically significant discrepancy from primary report, if
provided: None

## 2013-01-29 ENCOUNTER — Ambulatory Visit
Admission: RE | Admit: 2013-01-29 | Discharge: 2013-01-29 | Disposition: A | Discharge status: Home / Self Care | Payer: Medicare Other | Source: Ambulatory Visit | Attending: Internal Medicine | Admitting: Internal Medicine

## 2013-01-29 DIAGNOSIS — R928 Other abnormal and inconclusive findings on diagnostic imaging of breast: Secondary | ICD-10-CM

## 2013-02-16 ENCOUNTER — Other Ambulatory Visit: Payer: Self-pay

## 2013-02-16 MED ORDER — CLOPIDOGREL BISULFATE 75 MG PO TABS: 75.0000 mg | ORAL_TABLET | Freq: Every day | ORAL | Status: DC

## 2013-06-09 ENCOUNTER — Ambulatory Visit (INDEPENDENT_AMBULATORY_CARE_PROVIDER_SITE_OTHER): Payer: Medicare Other | Admitting: Nurse Practitioner

## 2013-06-09 ENCOUNTER — Encounter: Payer: Self-pay | Admitting: Nurse Practitioner

## 2013-06-09 VITALS — BP 179/86 | HR 87 | Ht 69.0 in | Wt 192.0 lb

## 2013-06-09 DIAGNOSIS — F17200 Nicotine dependence, unspecified, uncomplicated: Secondary | ICD-10-CM

## 2013-06-09 DIAGNOSIS — G459 Transient cerebral ischemic attack, unspecified: Secondary | ICD-10-CM

## 2013-06-09 DIAGNOSIS — Z720 Tobacco use: Secondary | ICD-10-CM

## 2013-06-09 MED ORDER — CLOPIDOGREL BISULFATE 75 MG PO TABS: 75.0000 mg | ORAL_TABLET | Freq: Every day | ORAL | Status: DC

## 2013-06-09 NOTE — Patient Instructions (Signed)
PLAN:  Continue Plavix for stroke prevention and have counseled the patient to quit smoking completely. Limit alcohol intake to 1 glass of wine per day. Maintain strict control of hypertension with blood pressure goal below 130/90, and lipids with LDL cholesterol goal below 100 mg/dL.   Return for followup in 6 months or call earlier if necessary.

## 2013-06-09 NOTE — Progress Notes (Signed)
PATIENT: Caitlin Galloway DOB: 05/02/1945  REASON FOR VISIT: follow up for TIA HISTORY FROM: patient  HISTORY OF PRESENT ILLNESS: Caitlin Galloway is a 52 year Caucasian lady who is seen for followup following hospital consultation for TIA on 05/05/12. She developed sudden onset of left upper extremity numbness and weakness while watching TV at home. She noticed difficulty while picking up a glass in controlling her left arm. When she got up and tried to walk to the bathroom she noticed that the left leg was affected as well. She called EMS and started improving by the time she got to the hospital. CT scan of the head as well as MRI scans the brain did not show any acute infarct. MRA of the brain and carotid Dopplers did not reveal any large vessel stenosis. Lipid profile showed total cholesterol 184, triglycerides 89, HDL 67 and LDL 99 mg percent. HbA1c was 5.5 and urine drug screen was negative. She was started on aspirin for secondary stroke prevention and she met inclusion exclusion criteria and enrolled in the POINT trial. She completed the trial successfully at 90 days and was switched from aspirin to Plavix. She states she's done well and has not had any recurrent symptoms. She has cut back smoking significantly but still smokes less than half pack per day and this time to quit completely. She states her blood pressure is well controlled and is usually in the 120s though it is elevated at 157/79 office today. She is tolerating Plavix well without bleeding, bruising or other side effects.   UPDATE 06/09/13 (LL):  Caitlin Galloway returns for TIA follow up today.  She has had no further TIA or stroke symptoms since the event 1 year ago.  She reports that her blood pressure has been well, though in office today is 179/86. She is tolerating Plavix well without significant bruising or bleeding.  She reports that she has been going through a severe depression since Christmas because of family disagreement over her  mother's care.  She has increased smoking during this time but wants to try to quit again in the future.  She sees her therapist regularly.    ROS:  14 system review of systems is positive for weight gain, fatigue, allergies, anxiety, depression, memory loss, insomnia, bruise easily.  ALLERGIES: Allergies  Allergen Reactions  . Codeine     unk  . Demerol [Meperidine]     unk  . Doxycycline     unk  . Effexor [Venlafaxine Hcl]     unk  . Pneumovax [Pneumococcal Polysaccharides]     unk  . Sulfa Antibiotics     unk    HOME MEDICATIONS: Outpatient Prescriptions Prior to Visit  Medication Sig Dispense Refill  . atorvastatin (LIPITOR) 20 MG tablet Take 1 tablet (20 mg total) by mouth daily.      Marland Kitchen buPROPion (WELLBUTRIN XL) 150 MG 24 hr tablet Take 1 tablet by mouth daily.      . clonazePAM (KLONOPIN) 1 MG tablet Take 1 mg by mouth 2 (two) times daily as needed for anxiety.       . Melatonin 5 MG TABS Take by mouth.      . SPIRIVA HANDIHALER 18 MCG inhalation capsule 1 capsule daily.      . clopidogrel (PLAVIX) 75 MG tablet Take 1 tablet (75 mg total) by mouth daily.  90 tablet  1  . ipratropium (ATROVENT HFA) 17 MCG/ACT inhaler Inhale 2 puffs into the lungs 4 (four) times  daily as needed for wheezing.       No facility-administered medications prior to visit.     PHYSICAL EXAM  Filed Vitals:   06/09/13 1356  BP: 179/86  Pulse: 87  Height: 5' 9"  (1.753 m)  Weight: 192 lb (87.091 kg)   Body mass index is 28.34 kg/(m^2).  Physical Exam  General: well developed, well nourished, seated, in no evident distress  Head: head normocephalic and atraumatic. Orohparynx benign  Neck: supple with no carotid or supraclavicular bruits  Cardiovascular: regular rate and rhythm, no murmurs  Musculoskeletal: no deformity  Skin: no rash/petichiae  Vascular: Normal pulses all extremities  Neurologic Exam  Mental Status: Awake and fully alert. Oriented to place and time. Recent and  remote memory intact. Attention span, concentration and fund of knowledge appropriate. Mood and affect appropriate.  Cranial Nerves: Fundoscopic exam reveals sharp disc margins. Pupils equal, briskly reactive to light. Extraocular movements full without nystagmus. Visual fields full to confrontation. Hearing intact. Facial sensation intact. Face, tongue, palate moves normally and symmetrically.  Motor: Normal bulk and tone. Normal strength in all tested extremity muscles.  Sensory.: intact to touch and pinprick and vibratory.  Coordination: Rapid alternating movements normal in all extremities. Finger-to-nose and heel-to-shin performed accurately bilaterally.  Gait and Station: Arises from chair without difficulty. Stance is normal. Gait demonstrates normal stride length and balance. Able to heel, toe and tandem walk without difficulty.  Reflexes: 1+ and symmetric. Toes downgoing.    ASSESSMENT AND PLAN 35 year lady with right brain TIA in March 2014 secondary to small vessel disease with vascular risk factors of hyperlipidemia, smoking and alcohol use only.   PLAN:  Continue Plavix for stroke prevention and have counseled the patient to quit smoking completely and limit alcohol intake to 1 glass of wine per day.  Maintain strict control of hypertension with blood pressure goal below 130/90, and lipids with LDL cholesterol goal below 100 mg/dL.  Return for followup in 6 months with or call earlier if necessary.  Meds ordered this encounter  Medications  . clopidogrel (PLAVIX) 75 MG tablet    Sig: Take 1 tablet (75 mg total) by mouth daily.    Dispense:  90 tablet    Refill:  2    Order Specific Question:  Supervising Provider    Answer:  Garvin Fila [[id number]]   Return in about 6 months (around 12/09/2013).  Philmore Pali, MSN, NP-C 06/09/2013, 2:21 PM [facility] [address] [place], [place] [address] [phone]  Note: This document was prepared with  digital dictation and possible smart phrase technology. Any transcriptional errors that result from this process are unintentional.

## 2013-11-23 ENCOUNTER — Other Ambulatory Visit: Payer: Self-pay | Admitting: Neurology

## 2013-12-09 ENCOUNTER — Encounter: Payer: Self-pay | Admitting: Nurse Practitioner

## 2013-12-09 ENCOUNTER — Ambulatory Visit (INDEPENDENT_AMBULATORY_CARE_PROVIDER_SITE_OTHER): Payer: Medicare Other | Admitting: Nurse Practitioner

## 2013-12-09 VITALS — BP 159/80 | HR 68 | Temp 97.9°F | Ht 69.0 in | Wt 188.6 lb

## 2013-12-09 DIAGNOSIS — G459 Transient cerebral ischemic attack, unspecified: Secondary | ICD-10-CM

## 2013-12-09 DIAGNOSIS — Z720 Tobacco use: Secondary | ICD-10-CM

## 2013-12-09 NOTE — Patient Instructions (Signed)
Continue Plavix for stroke prevention and have counseled the patient to quit smoking completely and limit alcohol intake to 1 glass of wine per day. Maintain strict control of hypertension with blood pressure goal below 130/90, and lipids with LDL cholesterol goal below 100 mg/dL.  Return for followup in 1 year or call earlier if necessary.    Stroke Prevention Some medical conditions and behaviors are associated with an increased chance of having a stroke. You may prevent a stroke by making healthy choices and managing medical conditions. HOW CAN I REDUCE MY RISK OF HAVING A STROKE?   Stay physically active. Get at least 30 minutes of activity on most or all days.  Do not smoke. It may also be helpful to avoid exposure to secondhand smoke.  Limit alcohol use. Moderate alcohol use is considered to be:  No more than 2 drinks per day for men.  No more than 1 drink per day for nonpregnant women.  Eat healthy foods. This involves:  Eating 5 or more servings of fruits and vegetables a day.  Making dietary changes that address high blood pressure (hypertension), high cholesterol, diabetes, or obesity.  Manage your cholesterol levels.  Making food choices that are high in fiber and low in saturated fat, trans fat, and cholesterol may control cholesterol levels.  Take any prescribed medicines to control cholesterol as directed by your health care provider.  Manage your diabetes.  Controlling your carbohydrate and sugar intake is recommended to manage diabetes.  Take any prescribed medicines to control diabetes as directed by your health care provider.  Control your hypertension.  Making food choices that are low in salt (sodium), saturated fat, trans fat, and cholesterol is recommended to manage hypertension.  Take any prescribed medicines to control hypertension as directed by your health care provider.  Maintain a healthy weight.  Reducing calorie intake and making food choices  that are low in sodium, saturated fat, trans fat, and cholesterol are recommended to manage weight.  Stop drug abuse.  Avoid taking birth control pills.  Talk to your health care provider about the risks of taking birth control pills if you are over 73 years old, smoke, get migraines, or have ever had a blood clot.  Get evaluated for sleep disorders (sleep apnea).  Talk to your health care provider about getting a sleep evaluation if you snore a lot or have excessive sleepiness.  Take medicines only as directed by your health care provider.  For some people, aspirin or blood thinners (anticoagulants) are helpful in reducing the risk of forming abnormal blood clots that can lead to stroke. If you have the irregular heart rhythm of atrial fibrillation, you should be on a blood thinner unless there is a good reason you cannot take them.  Understand all your medicine instructions.  Make sure that other conditions (such as anemia or atherosclerosis) are addressed. SEEK IMMEDIATE MEDICAL CARE IF:   You have sudden weakness or numbness of the face, arm, or leg, especially on one side of the body.  Your face or eyelid droops to one side.  You have sudden confusion.  You have trouble speaking (aphasia) or understanding.  You have sudden trouble seeing in one or both eyes.  You have sudden trouble walking.  You have dizziness.  You have a loss of balance or coordination.  You have a sudden, severe headache with no known cause.  You have new chest pain or an irregular heartbeat. Any of these symptoms may represent a serious  problem that is an emergency. Do not wait to see if the symptoms will go away. Get medical help at once. Call your local emergency services (911 in [place] Do not drive yourself to the hospital. Document Released: 03/28/2004 Document Revised: 07/05/2013 Document Reviewed: 08/21/2012 [facility] Patient Information 2015 [place], [place]. This information is not intended  to replace advice given to you by your health care provider. Make sure you discuss any questions you have with your health care provider.

## 2013-12-09 NOTE — Progress Notes (Signed)
PATIENT: Caitlin Galloway DOB: 26-Aug-1945  REASON FOR VISIT: routine follow up for TIA HISTORY FROM: patient  HISTORY OF PRESENT ILLNESS: UPDATE 12/09/13 (LL): She returns for followup of TIA, last visit was 6 months ago with me. She reports no further TIA or stroke symptoms since last visit. Blood pressure is again elevated, 159/80, but she states that it normally is in the 120s -130s over 70s. She states she has continued to battle depression this year but is feeling quite well at this time. She is not ready to attempt to quit smoking at this time. She is tolerating Plavix daily without significant bleeding or bruising. She has no new complaints today.  UPDATE 06/09/13 (LL): Caitlin Galloway returns for TIA follow up today. She has had no further TIA or stroke symptoms since the event 1 year ago. She reports that her blood pressure has been well, though in office today is 179/86. She is tolerating Plavix well without significant bruising or bleeding. She reports that she has been going through a severe depression since Christmas because of family disagreement over her mother's care. She has increased smoking during this time but wants to try to quit again in the future. She sees her therapist regularly.   Caitlin Galloway is a 68 year Caucasian lady who is seen for followup following hospital consultation for TIA on 05/05/12. She developed sudden onset of left upper extremity numbness and weakness while watching TV at home. She noticed difficulty while picking up a glass in controlling her left arm. When she got up and tried to walk to the bathroom she noticed that the left leg was affected as well. She called EMS and started improving by the time she got to the hospital. CT scan of the head as well as MRI scans the brain did not show any acute infarct. MRA of the brain and carotid Dopplers did not reveal any large vessel stenosis. Lipid profile showed total cholesterol 184, triglycerides 89, HDL 67 and LDL 99 mg  percent. HbA1c was 5.5 and urine drug screen was negative. She was started on aspirin for secondary stroke prevention and she met inclusion exclusion criteria and enrolled in the POINT trial. She completed the trial successfully at 90 days and was switched from aspirin to Plavix. She states she's done well and has not had any recurrent symptoms. She has cut back smoking significantly but still smokes less than half pack per day and this time to quit completely. She states her blood pressure is well controlled and is usually in the 120s though it is elevated at 157/79 office today. She is tolerating Plavix well without bleeding, bruising or other side effects.   ROS:  14 system review of systems is positive for fatigue, allergies, anxiety, depression, dizziness, cough, shortness of breath, leg swelling urinary incontinence.  ALLERGIES: Allergies  Allergen Reactions  . Codeine     unk  . Demerol [Meperidine]     unk  . Doxycycline     unk  . Effexor [Venlafaxine Hcl]     unk  . Pneumovax [Pneumococcal Polysaccharide Vaccine]     unk  . Sulfa Antibiotics     unk    HOME MEDICATIONS: Outpatient Prescriptions Prior to Visit  Medication Sig Dispense Refill  . atorvastatin (LIPITOR) 20 MG tablet Take 1 tablet (20 mg total) by mouth daily.      . clonazePAM (KLONOPIN) 1 MG tablet Take 1 mg by mouth 2 (two) times daily as needed for anxiety.       Marland Kitchen  clopidogrel (PLAVIX) 75 MG tablet Take 1 tablet (75 mg total) by mouth daily.  90 tablet  2  . clopidogrel (PLAVIX) 75 MG tablet TAKE 1 TABLET BY MOUTH DAILY  90 tablet  0  . Melatonin 5 MG TABS Take 5 mg by mouth daily. May take up to 20 mg tablet daily      . SPIRIVA HANDIHALER 18 MCG inhalation capsule 1 capsule daily.      Marland Kitchen buPROPion (WELLBUTRIN XL) 150 MG 24 hr tablet Take 1 tablet by mouth 3 (three) times daily.       . budesonide (PULMICORT) 180 MCG/ACT inhaler Inhale into the lungs 2 (two) times daily.       No facility-administered  medications prior to visit.    PHYSICAL EXAM Filed Vitals:   12/09/13 1510  Temp: 97.9 F (36.6 C)  TempSrc: Oral  Height: 5' 9"  (1.753 m)  Weight: 188 lb 9.6 oz (85.548 kg)   Body mass index is 27.84 kg/(m^2).  Physical Exam  General: well developed, well nourished, seated, in no evident distress  Head: head normocephalic and atraumatic. Orohparynx benign  Neck: supple with no carotid or supraclavicular bruits  Cardiovascular: regular rate and rhythm, no murmurs  Musculoskeletal: no deformity  Skin: no rash/petichiae  Vascular: Normal pulses all extremities   Neurologic Exam  Mental Status: Awake and fully alert. Oriented to place and time. Recent and remote memory intact. Attention span, concentration and fund of knowledge appropriate. Mood and affect appropriate.  Cranial Nerves: Fundoscopic exam reveals sharp disc margins. Pupils equal, briskly reactive to light. Extraocular movements full without nystagmus. Visual fields full to confrontation. Hearing intact. Facial sensation intact. Face, tongue, palate moves normally and symmetrically.  Motor: Normal bulk and tone. Normal strength in all tested extremity muscles.  Sensory: intact to touch and pinprick and vibratory.  Coordination: Rapid alternating movements normal in all extremities. Finger-to-nose and heel-to-shin performed accurately bilaterally.  Gait and Station: Arises from chair without difficulty. Stance is normal. Gait demonstrates normal stride length and balance. Able to heel, toe and tandem walk without difficulty.  Reflexes: 1+ and symmetric. Toes downgoing.   ASSESSMENT: 68 year-old Caucasian lady with right brain TIA in March 2014 secondary to small vessel disease with vascular risk factors of hyperlipidemia, smoking and alcohol use only.  Doing well without any repeat symptoms.  PLAN:  Continue Plavix for stroke prevention and have counseled the patient to quit smoking completely and limit alcohol intake to  1 glass of wine per day. Maintain strict control of hypertension with blood pressure goal below 130/90, and lipids with LDL cholesterol goal below 100 mg/dL.  Return for followup in 1 year or call earlier if necessary.   Rudi Rummage Lummie Montijo, MSN, FNP-BC, A/GNP-C 12/09/2013, 3:21 PM [facility] [address], [place], [place] [address] [phone]  Note: This document was prepared with digital dictation and possible smart phrase technology. Any transcriptional errors that result from this process are unintentional.

## 2013-12-13 NOTE — Progress Notes (Signed)
I agree with the above plan 

## 2013-12-17 ENCOUNTER — Other Ambulatory Visit: Payer: Self-pay

## 2014-02-09 ENCOUNTER — Other Ambulatory Visit: Payer: Self-pay

## 2014-03-10 ENCOUNTER — Other Ambulatory Visit: Payer: Self-pay | Admitting: Internal Medicine

## 2014-03-10 DIAGNOSIS — E2839 Other primary ovarian failure: Secondary | ICD-10-CM

## 2014-03-10 DIAGNOSIS — Z1231 Encounter for screening mammogram for malignant neoplasm of breast: Secondary | ICD-10-CM

## 2014-03-23 ENCOUNTER — Ambulatory Visit
Admission: RE | Admit: 2014-03-23 | Discharge: 2014-03-23 | Disposition: A | Discharge status: Home / Self Care | Payer: Medicare Other | Source: Ambulatory Visit | Attending: Internal Medicine | Admitting: Internal Medicine

## 2014-03-23 DIAGNOSIS — E2839 Other primary ovarian failure: Secondary | ICD-10-CM

## 2014-03-23 DIAGNOSIS — Z1231 Encounter for screening mammogram for malignant neoplasm of breast: Secondary | ICD-10-CM

## 2014-05-21 ENCOUNTER — Other Ambulatory Visit: Payer: Self-pay | Admitting: Neurology

## 2014-08-18 ENCOUNTER — Other Ambulatory Visit: Payer: Self-pay

## 2014-08-18 MED ORDER — CLOPIDOGREL BISULFATE 75 MG PO TABS: 75.0000 mg | ORAL_TABLET | Freq: Every day | ORAL | Status: DC

## 2014-08-29 ENCOUNTER — Other Ambulatory Visit: Payer: Self-pay

## 2014-11-30 ENCOUNTER — Telehealth: Payer: Self-pay | Admitting: Neurology

## 2014-11-30 NOTE — Telephone Encounter (Signed)
11/30/14-Called number on file, disconnected.  Called brother to get updated number.  He has same number we do.  Stated he would call his mom to see if she could get in touch with her. I was calling to offer patient an earlier appt with Hoyle Sauer on 10/4-10/5 or 10/6. You can transfer call to me.-SLB

## 2014-12-06 ENCOUNTER — Ambulatory Visit (INDEPENDENT_AMBULATORY_CARE_PROVIDER_SITE_OTHER): Payer: Medicare Other | Admitting: Nurse Practitioner

## 2014-12-06 ENCOUNTER — Encounter: Payer: Self-pay | Admitting: Nurse Practitioner

## 2014-12-06 VITALS — BP 150/79 | HR 91 | Ht 66.5 in | Wt 182.0 lb

## 2014-12-06 DIAGNOSIS — Z720 Tobacco use: Secondary | ICD-10-CM | POA: Diagnosis not present

## 2014-12-06 DIAGNOSIS — G459 Transient cerebral ischemic attack, unspecified: Secondary | ICD-10-CM | POA: Diagnosis not present

## 2014-12-06 DIAGNOSIS — I10 Essential (primary) hypertension: Secondary | ICD-10-CM | POA: Diagnosis not present

## 2014-12-06 MED ORDER — CLOPIDOGREL BISULFATE 75 MG PO TABS: 75.0000 mg | ORAL_TABLET | Freq: Every day | ORAL | Status: DC

## 2014-12-06 NOTE — Patient Instructions (Addendum)
Continue Plavix for secondary stroke prevention will refill Maintain strict control of hypertension with blood pressure goal below 130/90, today's reading 150/79 lipids with LDL cholesterol goal below 100 mg/dL.This is managed by Primary care  Return for followup in 1 year or call earlier if necessary if continued stable at that time will discharge from service

## 2014-12-06 NOTE — Progress Notes (Signed)
[facility]  PATIENT: Caitlin Galloway DOB: 10-02-1945   REASON FOR VISIT: follow up for TIA, hypertension HISTORY FROM: Patient    HISTORY OF PRESENT ILLNESS:UPDATE 12/06/2014 Caitlin Galloway, 69 year old female returns for follow-up. She was last seen in the office 12/09/2013 follow-up for TIA which occurred in 2014. She has not had further stroke or TIA symptoms. She is currently on Plavix needs refills. She was recently placed on blood pressure  medicine and her blood pressure is elevated in the office today at 150/79. She continues to smoke and has attempted to quit several times. She gets no regular exercise. She returns for reevaluation    12/09/13 (LL): She returns for followup of TIA, last visit was 6 months ago with me. She reports no further TIA or stroke symptoms since last visit. Blood pressure is again elevated, 159/80, but she states that it normally is in the 120s -130s over 70s. She states she has continued to battle depression this year but is feeling quite well at this time. She is not ready to attempt to quit smoking at this time. She is tolerating Plavix daily without significant bleeding or bruising. She has no new complaints today.  HISTORY:Caitlin Galloway is a 86 year Caucasian lady who is seen for followup following hospital consultation for TIA on 05/05/12. She developed sudden onset of left upper extremity numbness and weakness while watching TV at home. She noticed difficulty while picking up a glass in controlling her left arm. When she got up and tried to walk to the bathroom she noticed that the left leg was affected as well. She called EMS and started improving by the time she got to the hospital. CT scan of the head as well as MRI scans the brain did not show any acute infarct. MRA of the brain and carotid Dopplers did not reveal any large vessel stenosis. Lipid profile showed total cholesterol 184, triglycerides 89, HDL 67 and LDL 99 mg percent. HbA1c was 5.5  and urine drug screen was negative. She was started on aspirin for secondary stroke prevention and she met inclusion exclusion criteria and enrolled in the POINT trial. She completed the trial successfully at 90 days and was switched from aspirin to Plavix. She states she's done well and has not had any recurrent symptoms. She has cut back smoking significantly but still smokes less than half pack per day and this time to quit completely. She states her blood pressure is well controlled and is usually in the 120s though it is elevated at 157/79 office today. She is tolerating Plavix well without bleeding, bruising or other side effects.    REVIEW OF SYSTEMS: Full 14 system review of systems performed and notable only for those listed, all others are neg:  Constitutional: neg  Cardiovascular: neg Ear/Nose/Throat: neg  Skin: neg Eyes: neg Respiratory: Cough, wheezing or shortness of breath, COPD Gastroitestinal: Urinary urgency  Hematology/Lymphatic: neg  Endocrine: neg Musculoskeletal: Neck pain Allergy/Immunology: neg Neurological: neg Psychiatric: Depression Sleep : Insomnia   ALLERGIES: Allergies  Allergen Reactions  . Codeine     unk  . Demerol [Meperidine]     unk  . Doxycycline     unk  . Effexor [Venlafaxine Hcl]     unk  . Pneumovax [Pneumococcal Polysaccharide Vaccine]     unk  . Sulfa Antibiotics     unk    HOME MEDICATIONS: Outpatient Prescriptions Prior to Visit  Medication Sig Dispense Refill  . albuterol (VENTOLIN HFA) 108 (90  BASE) MCG/ACT inhaler Inhale into the lungs every 6 (six) hours as needed for wheezing or shortness of breath.    Marland Kitchen atorvastatin (LIPITOR) 20 MG tablet Take 1 tablet (20 mg total) by mouth daily.    Marland Kitchen buPROPion (WELLBUTRIN XL) 150 MG 24 hr tablet Take 1 tablet by mouth. Takes 3 tablets po qhs.    . clonazePAM (KLONOPIN) 1 MG tablet Take 1 mg by mouth 2 (two) times daily as needed for anxiety.     . clopidogrel (PLAVIX) 75 MG tablet Take  1 tablet (75 mg total) by mouth daily. 90 tablet 1  . FLUZONE HIGH-DOSE 0.5 ML SUSY     . Melatonin 5 MG TABS Take 5 mg by mouth daily. May take up to 30-40 mg tablet daily    . PROAIR HFA 108 (90 BASE) MCG/ACT inhaler As needed    . Respiratory Therapy Supplies (NEBULIZER) DEVI by Does not apply route as needed.    Marland Kitchen SPIRIVA HANDIHALER 18 MCG inhalation capsule 1 capsule daily.    Marland Kitchen buPROPion (WELLBUTRIN SR) 150 MG 12 hr tablet Take 150 mg by mouth daily. 3 tablets one time daily    . MELATONIN PO Take by mouth.     No facility-administered medications prior to visit.    PAST MEDICAL HISTORY: Past Medical History  Diagnosis Date  . Anxiety   . TIA (transient ischemic attack) 05/06/2012  . COPD (chronic obstructive pulmonary disease) ([place])     " mild "  . Shortness of breath   . GERD (gastroesophageal reflux disease)   . Mental disorder     bipolar  . Depression   . Cataracts, bilateral     slight  . Macular degeneration of both eyes     L mod, R mild    PAST SURGICAL HISTORY: Past Surgical History  Procedure Laterality Date  . Abdominal hysterectomy    . Cholecystectomy    . Appendectomy    . Colon surgery    . Cardiac catheterization      FAMILY HISTORY: Family History  Problem Relation Age of Onset  . COPD Father   . Stroke Father   . Breast cancer Sister     SOCIAL HISTORY: Social History   Social History  . Marital Status: Single    Spouse Name: N/A  . Number of Children: 0  . Years of Education: RN   Occupational History  . Retired     Therapist, sports   Social History Main Topics  . Smoking status: Current Every Day Smoker -- 2.00 packs/day for 45 years    Types: Cigarettes  . Smokeless tobacco: Never Used  . Alcohol Use: 0.6 oz/week    1 Glasses of wine per week     Comment: occassional   . Drug Use: No  . Sexual Activity: Not on file   Other Topics Concern  . Not on file   Social History Narrative   Patient lives at home alone. Single   Caffeine  use: 1-2 sodas daily   Retired   Left handed              PHYSICAL EXAM  Filed Vitals:   12/06/14 1301  BP: 150/79  Pulse: 91  Height: 5' 6.5" (1.689 m)  Weight: 182 lb (82.555 kg)   Body mass index is 28.94 kg/(m^2). General: well developed, well nourished, seated, in no evident distress  Head: head normocephalic and atraumatic. Orohparynx benign  Neck: supple with no carotid or supraclavicular bruits  Cardiovascular: regular rate and rhythm, no murmurs  Musculoskeletal: no deformity  Skin: no rash/petichiae  Vascular: Normal pulses all extremities   Neurologic Exam  Mental Status: Awake and fully alert. Oriented to place and time. Recent and remote memory intact. Attention span, concentration and fund of knowledge appropriate. Mood and affect appropriate.  Cranial Nerves: Fundoscopic exam reveals sharp disc margins. Pupils equal, briskly reactive to light. Extraocular movements full without nystagmus. Visual fields full to confrontation. Hearing intact. Facial sensation intact. Face, tongue, palate moves normally and symmetrically.  Motor: Normal bulk and tone. Normal strength in all tested extremity muscles.  Sensory: intact to touch and pinprick and vibratory.  Coordination: Rapid alternating movements normal in all extremities. Finger-to-nose and heel-to-shin performed accurately bilaterally.  Gait and Station: Arises from chair without difficulty. Stance is normal. Gait demonstrates normal stride length and balance. Able to heel, toe and tandem walk without difficulty.  Reflexes: 1+ and symmetric. Toes downgoing.  DIAGNOSTIC DATA (LABS, IMAGING, TESTING) -  ASSESSMENT AND PLAN  69 y.o. year old female  has a past medical history of Anxiety; TIA (transient ischemic attack) (05/06/2012); COPD (chronic obstructive pulmonary disease) ([place]); Shortness of breath; Depression; Cataracts, bilateral; and Macular degeneration of both eyes. here to follow-up. No further  stroke or TIA events since 2014. The patient is a current patient of Dr. Leonie Man  who is out of the office today . This note is sent to the work in doctor.     Continue Plavix for secondary stroke prevention will refill Maintain strict control of hypertension with blood pressure goal below 130/90, today's reading 150/79 lipids with LDL cholesterol goal below 100 mg/dL.This is managed by Primary care Stop smoking, this is the single most important risk factor  to prevent further stroke Try to exercise at least 15-30 minutes, walking daily, this will be good for your overall health and well-being  Return for followup in 1 year or call earlier if necessary if continued stable at that time will discharge Dennie Bible, [facility], [facility], [facility] [address], [place], [place] [address] [phone]

## 2014-12-07 NOTE — Progress Notes (Signed)
I have reviewed and agreed above plan. 

## 2014-12-13 ENCOUNTER — Ambulatory Visit: Payer: Medicare Other | Admitting: Neurology

## 2015-03-27 ENCOUNTER — Other Ambulatory Visit: Payer: Self-pay

## 2015-03-27 DIAGNOSIS — Z1231 Encounter for screening mammogram for malignant neoplasm of breast: Secondary | ICD-10-CM

## 2015-03-29 ENCOUNTER — Ambulatory Visit: Payer: Medicare Other

## 2015-06-01 ENCOUNTER — Ambulatory Visit
Admission: RE | Admit: 2015-06-01 | Discharge: 2015-06-01 | Disposition: A | Discharge status: Home / Self Care | Payer: Medicare Other | Source: Ambulatory Visit

## 2015-06-01 DIAGNOSIS — Z1231 Encounter for screening mammogram for malignant neoplasm of breast: Secondary | ICD-10-CM

## 2015-06-06 ENCOUNTER — Ambulatory Visit
Admission: RE | Admit: 2015-06-06 | Discharge: 2015-06-06 | Disposition: A | Discharge status: Home / Self Care | Payer: Medicare Other | Source: Ambulatory Visit | Attending: Internal Medicine | Admitting: Internal Medicine

## 2015-06-06 ENCOUNTER — Other Ambulatory Visit: Payer: Self-pay | Admitting: Internal Medicine

## 2015-06-06 DIAGNOSIS — M5489 Other dorsalgia: Secondary | ICD-10-CM

## 2015-11-25 ENCOUNTER — Other Ambulatory Visit: Payer: Self-pay | Admitting: Nurse Practitioner

## 2015-12-06 ENCOUNTER — Ambulatory Visit (INDEPENDENT_AMBULATORY_CARE_PROVIDER_SITE_OTHER): Payer: Medicare Other | Admitting: Nurse Practitioner

## 2015-12-06 ENCOUNTER — Encounter: Payer: Self-pay | Admitting: Nurse Practitioner

## 2015-12-06 VITALS — BP 150/79 | HR 78 | Ht 66.5 in | Wt 192.4 lb

## 2015-12-06 DIAGNOSIS — E785 Hyperlipidemia, unspecified: Secondary | ICD-10-CM

## 2015-12-06 DIAGNOSIS — Z720 Tobacco use: Secondary | ICD-10-CM

## 2015-12-06 DIAGNOSIS — G459 Transient cerebral ischemic attack, unspecified: Secondary | ICD-10-CM

## 2015-12-06 DIAGNOSIS — I10 Essential (primary) hypertension: Secondary | ICD-10-CM

## 2015-12-06 MED ORDER — CLOPIDOGREL BISULFATE 75 MG PO TABS: 75.0000 mg | ORAL_TABLET | Freq: Every day | ORAL | 1 refills | Status: DC

## 2015-12-06 NOTE — Patient Instructions (Addendum)
Keep systolic blood pressure less than 130, today's reading todays reading 150/79 continue Norvasc Lipids are followed by Dr. Deforest Hoyles continue Lipitor Carotid Doppler to be repeated Will schedule Continue Plavix for  secondary stroke prevention will renew for 6 months No further stroke or TIA symptoms since  05/05/2012 If recurrent stroke symptoms occur, call 911 and proceed to the hospital Stop smoking Stay active healthy diet and slow weight loss  Discharge from neurologic services at this time

## 2015-12-06 NOTE — Progress Notes (Signed)
[facility]  PATIENT: Caitlin Galloway DOB: [birth year]   REASON FOR VISIT: follow up for TIA, hypertension HISTORY FROM: Patient    HISTORY OF PRESENT ILLNESS:UPDATE 10/04/2017CM Ms. Schoenberger, 70 year old female returns for follow-up. She has history of TIA events in March 2014. She is currently on Plavix for secondary stroke prevention without further stroke or TIA symptoms without bruising or bleeding or other adverse symptoms. Blood pressure in the office today 150/79 and she is currently on Norvasc. She continues to smoke and was encouraged to quit , she has COPD,  she gets no regular exercise. Lipids are followed by her primary care Dr. Deforest Hoyles, she remains on Lipitor 20 mg daily. She denies myalgias She returns for reevaluation     UPDATE 12/06/2014 Ms. Helwig, 70 year old female returns for follow-up. She was last seen in the office 12/09/2013 follow-up for TIA which occurred in 2014. She has not had further stroke or TIA symptoms. She is currently on Plavix needs refills. She was recently placed on blood pressure  medicine and her blood pressure is elevated in the office today at 150/79. She continues to smoke and has attempted to quit several times. She gets no regular exercise. She returns for reevaluation    12/09/13 (LL): She returns for followup of TIA, last visit was 6 months ago with me. She reports no further TIA or stroke symptoms since last visit. Blood pressure is again elevated, 159/80, but she states that it normally is in the 120s -130s over 70s. She states she has continued to battle depression this year but is feeling quite well at this time. She is not ready to attempt to quit smoking at this time. She is tolerating Plavix daily without significant bleeding or bruising. She has no new complaints today.  HISTORY:Ms Mcanany is a 21 year Caucasian lady who is seen for followup following hospital consultation for TIA on 05/05/12. She developed sudden onset of  left upper extremity numbness and weakness while watching TV at home. She noticed difficulty while picking up a glass in controlling her left arm. When she got up and tried to walk to the bathroom she noticed that the left leg was affected as well. She called EMS and started improving by the time she got to the hospital. CT scan of the head as well as MRI scans the brain did not show any acute infarct. MRA of the brain and carotid Dopplers did not reveal any large vessel stenosis. Lipid profile showed total cholesterol 184, triglycerides 89, HDL 67 and LDL 99 mg percent. HbA1c was 5.5 and urine drug screen was negative. She was started on aspirin for secondary stroke prevention and she met inclusion exclusion criteria and enrolled in the POINT trial. She completed the trial successfully at 90 days and was switched from aspirin to Plavix. She states she's done well and has not had any recurrent symptoms. She has cut back smoking significantly but still smokes less than half pack per day and this time to quit completely. She states her blood pressure is well controlled and is usually in the 120s though it is elevated at 157/79 office today. She is tolerating Plavix well without bleeding, bruising or other side effects.    REVIEW OF SYSTEMS: Full 14 system review of systems performed and notable only for those listed, all others are neg:  Constitutional: Fatigue Cardiovascular: neg Ear/Nose/Throat: neg  Skin: neg Eyes: neg Respiratory: Cough, wheezing or shortness of breath, COPD Gastroitestinal:   Hematology/Lymphatic: neg  Endocrine: neg Musculoskeletal: Neck pain, back pain Allergy/Immunology: neg Neurological: neg Psychiatric: Depression, anxiety Sleep :    ALLERGIES: Allergies  Allergen Reactions  . Codeine     unk  . Demerol [Meperidine]     unk  . Doxycycline     unk  . Effexor [Venlafaxine Hcl]     unk  . Pneumovax [Pneumococcal Polysaccharide Vaccine]     unk  . Sulfa  Antibiotics     unk    HOME MEDICATIONS: Outpatient Medications Prior to Visit  Medication Sig Dispense Refill  . amLODipine (NORVASC) 2.5 MG tablet 2.5 mg daily.     Marland Kitchen atorvastatin (LIPITOR) 20 MG tablet Take 1 tablet (20 mg total) by mouth daily.    Marland Kitchen buPROPion (WELLBUTRIN XL) 150 MG 24 hr tablet Take 1 tablet by mouth. Takes 3 tablets po qhs.    Marland Kitchen CALTRATE 600+D 600-800 MG-UNIT TABS TK 1 T PO BID  4  . clonazePAM (KLONOPIN) 1 MG tablet Take 1 mg by mouth 2 (two) times daily as needed for anxiety.     . clopidogrel (PLAVIX) 75 MG tablet Take 1 tablet by mouth  daily 90 tablet 0  . FLUZONE HIGH-DOSE 0.5 ML SUSY     . Melatonin 5 MG TABS Take 5 mg by mouth daily. May take up to 30-40 mg tablet daily    . PROAIR HFA 108 (90 BASE) MCG/ACT inhaler As needed    . Respiratory Therapy Supplies (NEBULIZER) DEVI by Does not apply route as needed.    Marland Kitchen SPIRIVA HANDIHALER 18 MCG inhalation capsule 1 capsule daily.    Marland Kitchen albuterol (VENTOLIN HFA) 108 (90 BASE) MCG/ACT inhaler Inhale into the lungs every 6 (six) hours as needed for wheezing or shortness of breath.     No facility-administered medications prior to visit.     PAST MEDICAL HISTORY: Past Medical History:  Diagnosis Date  . Anxiety   . Cataracts, bilateral    slight  . Cataracts, bilateral   . COPD (chronic obstructive pulmonary disease) ([place])    " mild "  . Depression   . GERD (gastroesophageal reflux disease)   . Macular degeneration of both eyes    L mod, R mild  . Mental disorder    bipolar  . Shortness of breath   . TIA (transient ischemic attack) 05/06/2012    PAST SURGICAL HISTORY: Past Surgical History:  Procedure Laterality Date  . ABDOMINAL HYSTERECTOMY    . APPENDECTOMY    . CARDIAC CATHETERIZATION    . CHOLECYSTECTOMY    . COLON SURGERY      FAMILY HISTORY: Family History  Problem Relation Age of Onset  . COPD Father   . Stroke Father   . Breast cancer Sister     SOCIAL HISTORY: Social History    Social History  . Marital status: Single    Spouse name: N/A  . Number of children: 0  . Years of education: RN   Occupational History  . Retired     Therapist, sports   Social History Main Topics  . Smoking status: Current Every Day Smoker    Packs/day: 2.00    Years: 45.00    Types: Cigarettes  . Smokeless tobacco: Never Used  . Alcohol use 0.6 oz/week    1 Glasses of wine per week     Comment: occassional   . Drug use: No  . Sexual activity: Not on file   Other Topics Concern  . Not on file  Social History Narrative   Patient lives at home alone. Single   Caffeine use: 1-2 sodas daily   Retired   Left handed              PHYSICAL EXAM  Vitals:   12/06/15 1244  BP: (!) 150/79  Pulse: 78  Weight: 192 lb 6.4 oz (87.3 kg)  Height: 5' 6.5" (1.689 m)   Body mass index is 30.59 kg/m. General: well developed, well nourished, seated, in no evident distress  Head: head normocephalic and atraumatic. Orohparynx benign  Neck: supple with no carotid or supraclavicular bruits  Cardiovascular: regular rate and rhythm, no murmurs  Musculoskeletal: no deformity  Skin: no rash/petichiae  Vascular: Normal pulses all extremities   Neurologic Exam  Mental Status: Awake and fully alert. Oriented to place and time. Recent and remote memory intact. Attention span, concentration and fund of knowledge appropriate. Mood and affect appropriate.  Cranial Nerves:  Pupils equal, briskly reactive to light. Extraocular movements full without nystagmus. Visual fields full to confrontation. Hearing intact. Facial sensation intact. Face, tongue, palate moves normally and symmetrically.  Motor: Normal bulk and tone. Normal strength in all tested extremity muscles.  Sensory: intact to touch and pinprick and vibratory in the upper and lower extremities.  Coordination: Rapid alternating movements normal in all extremities. Finger-to-nose and heel-to-shin performed accurately bilaterally.   Gait and Station: Arises from chair without difficulty. Stance is normal. Gait demonstrates normal stride length and balance. Able to heel, toe and tandem walk without difficulty.  Reflexes: 1+ and symmetric. Toes downgoing.  DIAGNOSTIC DATA (LABS, IMAGING, TESTING) -  ASSESSMENT AND PLAN  70 y.o. year old female  has a past medical history of Anxiety; TIA (transient ischemic attack) (05/06/2012); COPD (chronic obstructive pulmonary disease) ([place]); Shortness of breath; Depression; Cataracts, bilateral; and Macular degeneration of both eyes. here to follow-up. No further stroke or TIA events since 2014. The patient is a current patient of Dr. Leonie Man  who is out of the office today . This note is sent to the work in doctor.     PLAN: Keep systolic blood pressure less than 130, today's reading todays reading 150/79 continue Norvasc Lipids are followed by Dr. Deforest Hoyles continue Lipitor Carotid Doppler to be repeated Will schedule Continue Plavix for  secondary stroke prevention will renew for 6 months No further stroke or TIA symptoms since  05/05/2012 If recurrent stroke symptoms occur, call 911 and proceed to the hospital Stop smoking Stay active healthy diet and slow weight loss  Discharge from neurologic services at this time Dennie Bible, [facility], [facility], [facility] [address], [place], [place] [address] [phone]

## 2015-12-06 NOTE — Progress Notes (Signed)
I have read the note, and I agree with the clinical assessment and plan.  WILLIS,CHARLES KEITH   

## 2015-12-12 ENCOUNTER — Telehealth: Payer: Self-pay | Admitting: Nurse Practitioner

## 2015-12-12 NOTE — Telephone Encounter (Signed)
Patient is ready to be scheduled for Doppler. NO PA needed. Thanks Hinton Dyer.

## 2015-12-20 ENCOUNTER — Ambulatory Visit (INDEPENDENT_AMBULATORY_CARE_PROVIDER_SITE_OTHER): Payer: Medicare Other

## 2015-12-20 DIAGNOSIS — E785 Hyperlipidemia, unspecified: Secondary | ICD-10-CM

## 2015-12-20 DIAGNOSIS — I10 Essential (primary) hypertension: Secondary | ICD-10-CM

## 2015-12-20 DIAGNOSIS — G459 Transient cerebral ischemic attack, unspecified: Secondary | ICD-10-CM | POA: Diagnosis not present

## 2015-12-26 ENCOUNTER — Telehealth: Payer: Self-pay | Admitting: Nurse Practitioner

## 2015-12-26 NOTE — Telephone Encounter (Signed)
Please let patient know carotid Doppler was negative for hemodynamically significant stenosis involving the extracranial carotid arteries bilaterally

## 2015-12-26 NOTE — Telephone Encounter (Signed)
LMVM for pt that was calling with test results.   Please return call.

## 2015-12-27 NOTE — Telephone Encounter (Signed)
Spoke to pt and let her know that the doppler study of her carotid arteries was negative for significant stenosis.  She verbalized understanding.

## 2016-02-15 ENCOUNTER — Telehealth: Payer: Self-pay | Admitting: *Deleted

## 2016-02-15 NOTE — Telephone Encounter (Signed)
Per Daun Peacock, NP  Left VM advising patient her carotid doppler US results showed that she does not have any stenosis or narrowing of her carotid arteries. Left number for any questions.

## 2016-02-16 ENCOUNTER — Telehealth: Payer: Self-pay

## 2016-02-16 NOTE — Telephone Encounter (Signed)
Rn spoke with Hilda Blades at [facility] at [phone]. Rn stated Dr. Leonie Man will be out of the office till March 06, 2016. Rn stated the form can be fax for the clearance at that time. Debra verbalized understanding, and will put it in her chart.

## 2016-03-07 NOTE — Telephone Encounter (Signed)
Clearance form fax to [facility] for colonoscopy at [phone].

## 2016-07-02 ENCOUNTER — Other Ambulatory Visit: Payer: Self-pay | Admitting: Internal Medicine

## 2016-07-02 DIAGNOSIS — Z1231 Encounter for screening mammogram for malignant neoplasm of breast: Secondary | ICD-10-CM

## 2016-07-18 ENCOUNTER — Ambulatory Visit: Payer: Medicare Other

## 2016-11-18 ENCOUNTER — Other Ambulatory Visit: Payer: Self-pay | Admitting: Internal Medicine

## 2016-11-18 DIAGNOSIS — M7989 Other specified soft tissue disorders: Secondary | ICD-10-CM

## 2016-11-20 ENCOUNTER — Ambulatory Visit
Admission: RE | Admit: 2016-11-20 | Discharge: 2016-11-20 | Disposition: A | Discharge status: Home / Self Care | Payer: Medicare Other | Source: Ambulatory Visit | Attending: Internal Medicine | Admitting: Internal Medicine

## 2016-11-20 DIAGNOSIS — Z1231 Encounter for screening mammogram for malignant neoplasm of breast: Secondary | ICD-10-CM

## 2016-11-22 ENCOUNTER — Other Ambulatory Visit: Payer: Self-pay

## 2016-11-22 ENCOUNTER — Ambulatory Visit (HOSPITAL_COMMUNITY): Payer: Medicare Other | Attending: Cardiology

## 2016-11-22 DIAGNOSIS — Z8673 Personal history of transient ischemic attack (TIA), and cerebral infarction without residual deficits: Secondary | ICD-10-CM | POA: Insufficient documentation

## 2016-11-22 DIAGNOSIS — I351 Nonrheumatic aortic (valve) insufficiency: Secondary | ICD-10-CM | POA: Insufficient documentation

## 2016-11-22 DIAGNOSIS — M7989 Other specified soft tissue disorders: Secondary | ICD-10-CM | POA: Diagnosis not present

## 2016-11-22 DIAGNOSIS — F419 Anxiety disorder, unspecified: Secondary | ICD-10-CM | POA: Diagnosis not present

## 2016-11-22 DIAGNOSIS — J449 Chronic obstructive pulmonary disease, unspecified: Secondary | ICD-10-CM | POA: Diagnosis not present

## 2017-07-08 ENCOUNTER — Ambulatory Visit (HOSPITAL_COMMUNITY): Payer: Medicare Other | Admitting: Psychiatry

## 2017-07-08 ENCOUNTER — Encounter (HOSPITAL_COMMUNITY): Payer: Self-pay | Admitting: Psychiatry

## 2017-07-08 VITALS — BP 132/80 | HR 74 | Ht 66.5 in | Wt 193.0 lb

## 2017-07-08 DIAGNOSIS — F17210 Nicotine dependence, cigarettes, uncomplicated: Secondary | ICD-10-CM

## 2017-07-08 DIAGNOSIS — Z87820 Personal history of traumatic brain injury: Secondary | ICD-10-CM

## 2017-07-08 DIAGNOSIS — H3530 Unspecified macular degeneration: Secondary | ICD-10-CM | POA: Diagnosis not present

## 2017-07-08 DIAGNOSIS — Z811 Family history of alcohol abuse and dependence: Secondary | ICD-10-CM

## 2017-07-08 DIAGNOSIS — Z818 Family history of other mental and behavioral disorders: Secondary | ICD-10-CM | POA: Diagnosis not present

## 2017-07-08 DIAGNOSIS — M2550 Pain in unspecified joint: Secondary | ICD-10-CM

## 2017-07-08 DIAGNOSIS — F331 Major depressive disorder, recurrent, moderate: Secondary | ICD-10-CM

## 2017-07-08 DIAGNOSIS — G4700 Insomnia, unspecified: Secondary | ICD-10-CM

## 2017-07-08 DIAGNOSIS — H269 Unspecified cataract: Secondary | ICD-10-CM | POA: Diagnosis not present

## 2017-07-08 DIAGNOSIS — F419 Anxiety disorder, unspecified: Secondary | ICD-10-CM | POA: Diagnosis not present

## 2017-07-08 DIAGNOSIS — Z6830 Body mass index (BMI) 30.0-30.9, adult: Secondary | ICD-10-CM

## 2017-07-08 DIAGNOSIS — M1990 Unspecified osteoarthritis, unspecified site: Secondary | ICD-10-CM

## 2017-07-08 DIAGNOSIS — J449 Chronic obstructive pulmonary disease, unspecified: Secondary | ICD-10-CM | POA: Diagnosis not present

## 2017-07-08 DIAGNOSIS — M810 Age-related osteoporosis without current pathological fracture: Secondary | ICD-10-CM | POA: Diagnosis not present

## 2017-07-08 DIAGNOSIS — E669 Obesity, unspecified: Secondary | ICD-10-CM

## 2017-07-08 DIAGNOSIS — F4010 Social phobia, unspecified: Secondary | ICD-10-CM

## 2017-07-08 DIAGNOSIS — M549 Dorsalgia, unspecified: Secondary | ICD-10-CM

## 2017-07-08 MED ORDER — LAMOTRIGINE 25 MG PO TABS: ORAL_TABLET | ORAL | 0 refills | Status: DC
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
## 2017-07-23 ENCOUNTER — Other Ambulatory Visit (HOSPITAL_COMMUNITY): Payer: Self-pay

## 2017-07-23 DIAGNOSIS — F331 Major depressive disorder, recurrent, moderate: Secondary | ICD-10-CM

## 2017-07-23 MED ORDER — LAMOTRIGINE 25 MG PO TABS: ORAL_TABLET | ORAL | 0 refills | Status: DC

## 2017-08-12 ENCOUNTER — Ambulatory Visit (HOSPITAL_COMMUNITY): Payer: Medicare Other | Admitting: Psychiatry

## 2017-08-19 ENCOUNTER — Telehealth (HOSPITAL_COMMUNITY): Payer: Self-pay

## 2017-08-19 ENCOUNTER — Other Ambulatory Visit (HOSPITAL_COMMUNITY): Payer: Self-pay | Admitting: Psychiatry

## 2017-08-19 NOTE — Telephone Encounter (Signed)
Patient is calling to let you know that the Lamictal was causing itching and dizziness, patient has stopped and is feeling better now. Patient is also asking for a refill on Klonopin 1 mg po qhs, she uses [facility] and needs a 90 day supply. Please review and advise, thank you

## 2017-08-19 NOTE — Telephone Encounter (Signed)
I have not given her Klonopin.  She must contact the physician who prescribed Klonopin.  We should also get gene testing.  If she has a rash then she can stop Lamictal but usually itching could always in few days.

## 2017-08-20 ENCOUNTER — Other Ambulatory Visit (HOSPITAL_COMMUNITY): Payer: Self-pay | Admitting: Psychiatry

## 2017-08-20 NOTE — Telephone Encounter (Signed)
Please call Klonopin 1 mg at bedtime.  We will discuss more on her next appointment.

## 2017-08-20 NOTE — Telephone Encounter (Signed)
I called the patient and she states that she thought you were taking over all of her psychiatric medications as she is no longer going to her previous psychiatrist. Please advise, thank you

## 2017-08-22 MED ORDER — CLONAZEPAM 1 MG PO TABS: ORAL_TABLET | ORAL | 0 refills | Status: DC

## 2017-08-22 NOTE — Telephone Encounter (Signed)
Called it in and called the patient to let her know

## 2017-09-02 ENCOUNTER — Other Ambulatory Visit (HOSPITAL_COMMUNITY): Payer: Self-pay | Admitting: Psychiatry

## 2017-09-02 DIAGNOSIS — F331 Major depressive disorder, recurrent, moderate: Secondary | ICD-10-CM

## 2017-09-08 ENCOUNTER — Telehealth (HOSPITAL_COMMUNITY): Payer: Self-pay

## 2017-09-08 NOTE — Telephone Encounter (Signed)
Patient is requesting a refill on Clonazepam 1mg  tabs. Patient said that she has enough to last for about 7 to 10 days. Patient  said that she prefers to go back on 2 mg as oppose to the 1 mg. She also said can she get a 90 day supply, and the prescription be sent to [facility] then the cost would be free for her. Appointment had to be rescheduled. Please advise

## 2017-09-09 ENCOUNTER — Ambulatory Visit (HOSPITAL_COMMUNITY): Payer: Medicare Other | Admitting: Psychiatry

## 2017-09-10 NOTE — Telephone Encounter (Signed)
A one time refill of patient's Lamictal 25 mg, 2 daily authorized by Dr Dwyane Dee, #[id number] with no refills and sent to patient's pharmacy as approved.

## 2017-09-11 ENCOUNTER — Other Ambulatory Visit (HOSPITAL_COMMUNITY): Payer: Self-pay | Admitting: Psychiatry

## 2017-09-11 NOTE — Telephone Encounter (Signed)
Pt got a 30 day supply on 6/21. She is not due for a Klonopin refill until 7/21. No refill today

## 2017-09-11 NOTE — Telephone Encounter (Signed)
Refill 1mg  dose

## 2017-09-16 MED ORDER — CLONAZEPAM 1 MG PO TABS: ORAL_TABLET | ORAL | 0 refills | Status: AC

## 2017-10-20 ENCOUNTER — Ambulatory Visit (HOSPITAL_COMMUNITY): Payer: Medicare Other | Admitting: Psychiatry

## 2017-10-24 ENCOUNTER — Other Ambulatory Visit: Payer: Self-pay | Admitting: Internal Medicine

## 2017-10-24 DIAGNOSIS — Z1231 Encounter for screening mammogram for malignant neoplasm of breast: Secondary | ICD-10-CM

## 2017-10-30 ENCOUNTER — Ambulatory Visit (HOSPITAL_COMMUNITY): Payer: Medicare Other | Admitting: Psychiatry

## 2017-11-21 ENCOUNTER — Ambulatory Visit: Payer: Self-pay

## 2018-02-27 ENCOUNTER — Other Ambulatory Visit: Payer: Self-pay | Admitting: Internal Medicine

## 2018-02-27 DIAGNOSIS — Z1231 Encounter for screening mammogram for malignant neoplasm of breast: Secondary | ICD-10-CM

## 2018-03-10 ENCOUNTER — Ambulatory Visit
Admission: RE | Admit: 2018-03-10 | Discharge: 2018-03-10 | Disposition: A | Discharge status: Home / Self Care | Payer: Medicare Other | Source: Ambulatory Visit

## 2018-03-10 DIAGNOSIS — Z1231 Encounter for screening mammogram for malignant neoplasm of breast: Secondary | ICD-10-CM

## 2019-01-07 ENCOUNTER — Other Ambulatory Visit: Payer: Self-pay | Admitting: Internal Medicine

## 2019-01-07 DIAGNOSIS — M5136 Other intervertebral disc degeneration, lumbar region: Secondary | ICD-10-CM

## 2019-01-08 ENCOUNTER — Other Ambulatory Visit: Payer: Self-pay | Admitting: Internal Medicine

## 2019-01-08 DIAGNOSIS — F17210 Nicotine dependence, cigarettes, uncomplicated: Secondary | ICD-10-CM

## 2019-01-15 ENCOUNTER — Other Ambulatory Visit: Payer: Self-pay

## 2019-01-15 ENCOUNTER — Ambulatory Visit
Admission: RE | Admit: 2019-01-15 | Discharge: 2019-01-15 | Disposition: A | Discharge status: Home / Self Care | Payer: Medicare Other | Source: Ambulatory Visit | Attending: Internal Medicine | Admitting: Internal Medicine

## 2019-01-15 DIAGNOSIS — F17210 Nicotine dependence, cigarettes, uncomplicated: Secondary | ICD-10-CM

## 2019-01-19 ENCOUNTER — Ambulatory Visit
Admission: RE | Admit: 2019-01-19 | Discharge: 2019-01-19 | Disposition: A | Discharge status: Home / Self Care | Payer: Medicare Other | Source: Ambulatory Visit | Attending: Internal Medicine | Admitting: Internal Medicine

## 2019-01-19 ENCOUNTER — Other Ambulatory Visit: Payer: Self-pay

## 2019-01-19 DIAGNOSIS — M5136 Other intervertebral disc degeneration, lumbar region: Secondary | ICD-10-CM

## 2019-02-01 ENCOUNTER — Other Ambulatory Visit: Payer: Medicare Other

## 2019-02-11 ENCOUNTER — Ambulatory Visit: Payer: Medicare Other

## 2019-02-17 ENCOUNTER — Ambulatory Visit: Payer: Medicare Other

## 2019-03-10 ENCOUNTER — Other Ambulatory Visit: Payer: Self-pay | Admitting: Internal Medicine

## 2019-03-10 DIAGNOSIS — Z1231 Encounter for screening mammogram for malignant neoplasm of breast: Secondary | ICD-10-CM

## 2019-03-12 ENCOUNTER — Ambulatory Visit
Admission: RE | Admit: 2019-03-12 | Discharge: 2019-03-12 | Disposition: A | Discharge status: Home / Self Care | Payer: Medicare Other | Source: Ambulatory Visit

## 2019-03-12 DIAGNOSIS — Z1231 Encounter for screening mammogram for malignant neoplasm of breast: Secondary | ICD-10-CM

## 2020-02-15 ENCOUNTER — Other Ambulatory Visit: Payer: Self-pay | Admitting: Internal Medicine

## 2020-02-15 DIAGNOSIS — Z1231 Encounter for screening mammogram for malignant neoplasm of breast: Secondary | ICD-10-CM

## 2020-03-10 DIAGNOSIS — M810 Age-related osteoporosis without current pathological fracture: Secondary | ICD-10-CM | POA: Diagnosis not present

## 2020-03-10 DIAGNOSIS — J449 Chronic obstructive pulmonary disease, unspecified: Secondary | ICD-10-CM | POA: Diagnosis not present

## 2020-03-10 DIAGNOSIS — E7800 Pure hypercholesterolemia, unspecified: Secondary | ICD-10-CM | POA: Diagnosis not present

## 2020-03-10 DIAGNOSIS — I10 Essential (primary) hypertension: Secondary | ICD-10-CM | POA: Diagnosis not present

## 2020-03-10 DIAGNOSIS — G459 Transient cerebral ischemic attack, unspecified: Secondary | ICD-10-CM | POA: Diagnosis not present

## 2020-03-10 DIAGNOSIS — N183 Chronic kidney disease, stage 3 unspecified: Secondary | ICD-10-CM | POA: Diagnosis not present

## 2020-03-23 DIAGNOSIS — H353112 Nonexudative age-related macular degeneration, right eye, intermediate dry stage: Secondary | ICD-10-CM | POA: Diagnosis not present

## 2020-03-23 DIAGNOSIS — H353221 Exudative age-related macular degeneration, left eye, with active choroidal neovascularization: Secondary | ICD-10-CM | POA: Diagnosis not present

## 2020-03-24 ENCOUNTER — Ambulatory Visit
Admission: RE | Admit: 2020-03-24 | Discharge: 2020-03-24 | Disposition: A | Discharge status: Home / Self Care | Payer: Medicare Other | Source: Ambulatory Visit | Attending: Internal Medicine | Admitting: Internal Medicine

## 2020-03-24 ENCOUNTER — Other Ambulatory Visit: Payer: Self-pay

## 2020-03-24 DIAGNOSIS — Z1231 Encounter for screening mammogram for malignant neoplasm of breast: Secondary | ICD-10-CM

## 2020-04-03 DIAGNOSIS — G459 Transient cerebral ischemic attack, unspecified: Secondary | ICD-10-CM | POA: Diagnosis not present

## 2020-04-03 DIAGNOSIS — R7309 Other abnormal glucose: Secondary | ICD-10-CM | POA: Diagnosis not present

## 2020-04-03 DIAGNOSIS — F17210 Nicotine dependence, cigarettes, uncomplicated: Secondary | ICD-10-CM | POA: Diagnosis not present

## 2020-04-03 DIAGNOSIS — Z0000 Encounter for general adult medical examination without abnormal findings: Secondary | ICD-10-CM | POA: Diagnosis not present

## 2020-04-03 DIAGNOSIS — I10 Essential (primary) hypertension: Secondary | ICD-10-CM | POA: Diagnosis not present

## 2020-04-03 DIAGNOSIS — M810 Age-related osteoporosis without current pathological fracture: Secondary | ICD-10-CM | POA: Diagnosis not present

## 2020-04-03 DIAGNOSIS — E7800 Pure hypercholesterolemia, unspecified: Secondary | ICD-10-CM | POA: Diagnosis not present

## 2020-04-03 DIAGNOSIS — E871 Hypo-osmolality and hyponatremia: Secondary | ICD-10-CM | POA: Diagnosis not present

## 2020-04-03 DIAGNOSIS — J449 Chronic obstructive pulmonary disease, unspecified: Secondary | ICD-10-CM | POA: Diagnosis not present

## 2020-04-03 DIAGNOSIS — M5136 Other intervertebral disc degeneration, lumbar region: Secondary | ICD-10-CM | POA: Diagnosis not present

## 2020-04-03 DIAGNOSIS — K2100 Gastro-esophageal reflux disease with esophagitis, without bleeding: Secondary | ICD-10-CM | POA: Diagnosis not present

## 2020-04-03 DIAGNOSIS — N183 Chronic kidney disease, stage 3 unspecified: Secondary | ICD-10-CM | POA: Diagnosis not present

## 2020-04-08 ENCOUNTER — Emergency Department (HOSPITAL_COMMUNITY)
Admission: EM | Admit: 2020-04-08 | Discharge: 2020-04-08 | Disposition: A | Discharge status: Home / Self Care | Payer: Medicare Other | Attending: Emergency Medicine | Admitting: Emergency Medicine

## 2020-04-08 ENCOUNTER — Other Ambulatory Visit: Payer: Self-pay

## 2020-04-08 DIAGNOSIS — W260XXA Contact with knife, initial encounter: Secondary | ICD-10-CM | POA: Diagnosis not present

## 2020-04-08 DIAGNOSIS — Z5321 Procedure and treatment not carried out due to patient leaving prior to being seen by health care provider: Secondary | ICD-10-CM | POA: Insufficient documentation

## 2020-04-08 DIAGNOSIS — Y93G9 Activity, other involving cooking and grilling: Secondary | ICD-10-CM | POA: Insufficient documentation

## 2020-04-08 DIAGNOSIS — S61411A Laceration without foreign body of right hand, initial encounter: Secondary | ICD-10-CM | POA: Insufficient documentation

## 2020-04-08 NOTE — ED Triage Notes (Addendum)
Pt presents to ED POV. Pt c/o lac to R palm. Pt reports she was cutting onions and accidentally got hand. Pt reports that she take Plavix. Bleeding controlled, R radial +2. UTD on tetanus

## 2020-04-08 NOTE — ED Notes (Signed)
Pt leaving AMA, advised to return if symptoms worsen. 

## 2020-04-19 DIAGNOSIS — E7800 Pure hypercholesterolemia, unspecified: Secondary | ICD-10-CM | POA: Diagnosis not present

## 2020-04-19 DIAGNOSIS — I10 Essential (primary) hypertension: Secondary | ICD-10-CM | POA: Diagnosis not present

## 2020-04-19 DIAGNOSIS — G459 Transient cerebral ischemic attack, unspecified: Secondary | ICD-10-CM | POA: Diagnosis not present

## 2020-04-19 DIAGNOSIS — M810 Age-related osteoporosis without current pathological fracture: Secondary | ICD-10-CM | POA: Diagnosis not present

## 2020-04-19 DIAGNOSIS — N183 Chronic kidney disease, stage 3 unspecified: Secondary | ICD-10-CM | POA: Diagnosis not present

## 2020-04-19 DIAGNOSIS — J449 Chronic obstructive pulmonary disease, unspecified: Secondary | ICD-10-CM | POA: Diagnosis not present

## 2020-05-01 DIAGNOSIS — H353112 Nonexudative age-related macular degeneration, right eye, intermediate dry stage: Secondary | ICD-10-CM | POA: Diagnosis not present

## 2020-05-01 DIAGNOSIS — H43813 Vitreous degeneration, bilateral: Secondary | ICD-10-CM | POA: Diagnosis not present

## 2020-05-01 DIAGNOSIS — H353221 Exudative age-related macular degeneration, left eye, with active choroidal neovascularization: Secondary | ICD-10-CM | POA: Diagnosis not present

## 2020-05-01 DIAGNOSIS — H31092 Other chorioretinal scars, left eye: Secondary | ICD-10-CM | POA: Diagnosis not present

## 2020-06-01 DIAGNOSIS — H353112 Nonexudative age-related macular degeneration, right eye, intermediate dry stage: Secondary | ICD-10-CM | POA: Diagnosis not present

## 2020-06-01 DIAGNOSIS — H353221 Exudative age-related macular degeneration, left eye, with active choroidal neovascularization: Secondary | ICD-10-CM | POA: Diagnosis not present

## 2020-06-09 ENCOUNTER — Emergency Department (HOSPITAL_COMMUNITY): Payer: Medicare Other

## 2020-06-09 ENCOUNTER — Other Ambulatory Visit: Payer: Self-pay

## 2020-06-09 ENCOUNTER — Emergency Department (HOSPITAL_COMMUNITY)
Admission: EM | Admit: 2020-06-09 | Discharge: 2020-06-09 | Disposition: A | Discharge status: Home / Self Care | Payer: Medicare Other | Attending: Emergency Medicine | Admitting: Emergency Medicine

## 2020-06-09 ENCOUNTER — Encounter (HOSPITAL_COMMUNITY): Payer: Self-pay | Admitting: Emergency Medicine

## 2020-06-09 DIAGNOSIS — S6991XA Unspecified injury of right wrist, hand and finger(s), initial encounter: Secondary | ICD-10-CM | POA: Diagnosis present

## 2020-06-09 DIAGNOSIS — F17210 Nicotine dependence, cigarettes, uncomplicated: Secondary | ICD-10-CM | POA: Insufficient documentation

## 2020-06-09 DIAGNOSIS — R590 Localized enlarged lymph nodes: Secondary | ICD-10-CM | POA: Diagnosis not present

## 2020-06-09 DIAGNOSIS — J449 Chronic obstructive pulmonary disease, unspecified: Secondary | ICD-10-CM | POA: Diagnosis not present

## 2020-06-09 DIAGNOSIS — Y9289 Other specified places as the place of occurrence of the external cause: Secondary | ICD-10-CM | POA: Diagnosis not present

## 2020-06-09 DIAGNOSIS — R222 Localized swelling, mass and lump, trunk: Secondary | ICD-10-CM | POA: Diagnosis not present

## 2020-06-09 DIAGNOSIS — I10 Essential (primary) hypertension: Secondary | ICD-10-CM | POA: Diagnosis not present

## 2020-06-09 DIAGNOSIS — Z9049 Acquired absence of other specified parts of digestive tract: Secondary | ICD-10-CM | POA: Diagnosis not present

## 2020-06-09 DIAGNOSIS — Z7901 Long term (current) use of anticoagulants: Secondary | ICD-10-CM | POA: Insufficient documentation

## 2020-06-09 DIAGNOSIS — Z981 Arthrodesis status: Secondary | ICD-10-CM | POA: Diagnosis not present

## 2020-06-09 DIAGNOSIS — J9811 Atelectasis: Secondary | ICD-10-CM | POA: Diagnosis not present

## 2020-06-09 DIAGNOSIS — W01198A Fall on same level from slipping, tripping and stumbling with subsequent striking against other object, initial encounter: Secondary | ICD-10-CM | POA: Diagnosis not present

## 2020-06-09 DIAGNOSIS — S80211A Abrasion, right knee, initial encounter: Secondary | ICD-10-CM | POA: Insufficient documentation

## 2020-06-09 DIAGNOSIS — R911 Solitary pulmonary nodule: Secondary | ICD-10-CM | POA: Diagnosis not present

## 2020-06-09 DIAGNOSIS — M25531 Pain in right wrist: Secondary | ICD-10-CM | POA: Diagnosis not present

## 2020-06-09 DIAGNOSIS — Z8673 Personal history of transient ischemic attack (TIA), and cerebral infarction without residual deficits: Secondary | ICD-10-CM | POA: Diagnosis not present

## 2020-06-09 DIAGNOSIS — Z7902 Long term (current) use of antithrombotics/antiplatelets: Secondary | ICD-10-CM | POA: Insufficient documentation

## 2020-06-09 DIAGNOSIS — S299XXA Unspecified injury of thorax, initial encounter: Secondary | ICD-10-CM | POA: Insufficient documentation

## 2020-06-09 DIAGNOSIS — S0011XA Contusion of right eyelid and periocular area, initial encounter: Secondary | ICD-10-CM | POA: Diagnosis not present

## 2020-06-09 DIAGNOSIS — Z79899 Other long term (current) drug therapy: Secondary | ICD-10-CM | POA: Insufficient documentation

## 2020-06-09 DIAGNOSIS — R519 Headache, unspecified: Secondary | ICD-10-CM | POA: Diagnosis not present

## 2020-06-09 DIAGNOSIS — W19XXXA Unspecified fall, initial encounter: Secondary | ICD-10-CM

## 2020-06-09 DIAGNOSIS — J321 Chronic frontal sinusitis: Secondary | ICD-10-CM | POA: Diagnosis not present

## 2020-06-09 DIAGNOSIS — R1011 Right upper quadrant pain: Secondary | ICD-10-CM | POA: Insufficient documentation

## 2020-06-09 DIAGNOSIS — S2241XA Multiple fractures of ribs, right side, initial encounter for closed fracture: Secondary | ICD-10-CM | POA: Diagnosis not present

## 2020-06-09 DIAGNOSIS — S298XXA Other specified injuries of thorax, initial encounter: Secondary | ICD-10-CM

## 2020-06-09 DIAGNOSIS — S52501A Unspecified fracture of the lower end of right radius, initial encounter for closed fracture: Secondary | ICD-10-CM | POA: Insufficient documentation

## 2020-06-09 DIAGNOSIS — Y9301 Activity, walking, marching and hiking: Secondary | ICD-10-CM | POA: Diagnosis not present

## 2020-06-09 DIAGNOSIS — G9389 Other specified disorders of brain: Secondary | ICD-10-CM | POA: Diagnosis not present

## 2020-06-09 DIAGNOSIS — I700 Atherosclerosis of aorta: Secondary | ICD-10-CM | POA: Diagnosis not present

## 2020-06-09 DIAGNOSIS — M7989 Other specified soft tissue disorders: Secondary | ICD-10-CM | POA: Diagnosis not present

## 2020-06-09 DIAGNOSIS — J3489 Other specified disorders of nose and nasal sinuses: Secondary | ICD-10-CM | POA: Diagnosis not present

## 2020-06-09 LAB — COMPREHENSIVE METABOLIC PANEL
ALT: 24 U/L (ref 0–44)
AST: 32 U/L (ref 15–41)
Albumin: 4.1 g/dL (ref 3.5–5.0)
Alkaline Phosphatase: 90 U/L (ref 38–126)
Anion gap: 9 (ref 5–15)
BUN: 9 mg/dL (ref 8–23)
CO2: 23 mmol/L (ref 22–32)
Calcium: 9.4 mg/dL (ref 8.9–10.3)
Chloride: 101 mmol/L (ref 98–111)
Creatinine, Ser: 1.08 mg/dL — ABNORMAL HIGH (ref 0.44–1.00)
GFR, Estimated: 54 mL/min — ABNORMAL LOW (ref >60)
Glucose, Bld: 118 mg/dL — ABNORMAL HIGH (ref 70–99)
Potassium: 3.8 mmol/L (ref 3.5–5.1)
Sodium: 133 mmol/L — ABNORMAL LOW (ref 135–145)
Total Bilirubin: 0.8 mg/dL (ref 0.3–1.2)
Total Protein: 6.9 g/dL (ref 6.5–8.1)

## 2020-06-09 LAB — CBC
HCT: 44.6 % (ref 36.0–46.0)
Hemoglobin: 15.3 g/dL — ABNORMAL HIGH (ref 12.0–15.0)
MCH: 32.3 pg (ref 26.0–34.0)
MCHC: 34.3 g/dL (ref 30.0–36.0)
MCV: 94.1 fL (ref 80.0–100.0)
Platelets: 264 10*3/uL (ref 150–400)
RBC: 4.74 MIL/uL (ref 3.87–5.11)
RDW: 12.7 % (ref 11.5–15.5)
WBC: 8.5 10*3/uL (ref 4.0–10.5)
nRBC: 0 % (ref 0.0–0.2)

## 2020-06-09 MED ORDER — FENTANYL CITRATE (PF) 100 MCG/2ML IJ SOLN
50.0000 ug | Freq: Once | INTRAMUSCULAR | Status: AC
  Administered 2020-06-09: 50 ug via INTRAVENOUS
  Filled 2020-06-09: qty 2

## 2020-06-09 MED ORDER — TRAMADOL HCL 50 MG PO TABS: 50.0000 mg | ORAL_TABLET | Freq: Four times a day (QID) | ORAL | 0 refills | Status: DC | PRN

## 2020-06-09 MED ORDER — IOHEXOL 300 MG/ML  SOLN
100.0000 mL | Freq: Once | INTRAMUSCULAR | Status: AC | PRN
  Administered 2020-06-09: 100 mL via INTRAVENOUS

## 2020-06-09 NOTE — Discharge Instructions (Signed)
We did not see any broken ribs on the CT scan but watch for increased pain or trouble breathing.  Follow-up with the hand surgeon.

## 2020-06-09 NOTE — ED Notes (Signed)
Patient transported to CT 

## 2020-06-09 NOTE — ED Provider Notes (Signed)
[facility] EMERGENCY DEPARTMENT Provider Note   CSN: [id number] Arrival date & time: 06/09/20  4235     History No chief complaint on file.   Caitlin Galloway is a 75 y.o. female.  HPI Patient presents after fall.  Last night tripped on some pine straw and landed outside.  Pain in head right chest and right wrist.  Also abrasion to right knee.  No loss conscious.  Patient is on Plavix.  No numbness weakness.  No neck pain.  Has been ambulatory.  No difficulty breathing.    Past Medical History:  Diagnosis Date  . Anxiety   . Cataracts, bilateral    slight  . Cataracts, bilateral   . COPD (chronic obstructive pulmonary disease) ([place])    " mild "  . Depression   . GERD (gastroesophageal reflux disease)   . Macular degeneration of both eyes    L mod, R mild  . Mental disorder    bipolar  . Shortness of breath   . TIA (transient ischemic attack) 05/06/2012    Patient Active Problem List   Diagnosis Date Noted  . Hyperlipemia 12/06/2015  . HTN (hypertension) 12/06/2014  . COPD exacerbation ([place]) 05/06/2012  . Hypokalemia 05/06/2012  . TIA (transient ischemic attack) 05/05/2012  . Tobacco abuse 05/05/2012  . Bipolar disorder, unspecified ([place]) 05/05/2012    Past Surgical History:  Procedure Laterality Date  . ABDOMINAL HYSTERECTOMY    . APPENDECTOMY    . BREAST CYST ASPIRATION Right 1987  . CARDIAC CATHETERIZATION    . CHOLECYSTECTOMY    . COLON SURGERY       OB History   No obstetric history on file.     Family History  Problem Relation Age of Onset  . COPD Father   . Stroke Father   . Bipolar disorder Father   . Alcohol abuse Father   . Breast cancer Sister   . Bipolar disorder Maternal Aunt     Social History   Tobacco Use  . Smoking status: Current Every Day Smoker    Packs/day: 2.00    Years: 45.00    Pack years: 90.00    Types: Cigarettes  . Smokeless tobacco: Never Used  Substance Use Topics  . Alcohol use: Yes     Alcohol/week: 1.0 standard drink    Types: 1 Glasses of wine per week    Comment: occassional   . Drug use: No    Home Medications Prior to Admission medications   Medication Sig Start Date End Date Taking? Authorizing Provider  traMADol (ULTRAM) 50 MG tablet Take 1 tablet (50 mg total) by mouth every 6 (six) hours as needed. 06/09/20  Yes Davonna Belling, MD  amLODipine (NORVASC) 2.5 MG tablet 2.5 mg daily.  12/05/14   [provider]  atorvastatin (LIPITOR) 20 MG tablet Take 1 tablet (20 mg total) by mouth daily. 05/06/12   Marius Ditch, MD  buPROPion (WELLBUTRIN XL) 300 MG 24 hr tablet Take 300 mg by mouth daily.    [provider]  CALTRATE 600+D 600-800 MG-UNIT TABS TK 1 T PO BID 11/17/14   [provider]  clonazePAM (KLONOPIN) 1 MG tablet Take 1 tablet (1 mg) at bedtime 09/16/17   Arfeen, Arlyce Harman, MD  clopidogrel (PLAVIX) 75 MG tablet Take 1 tablet (75 mg total) by mouth daily. 12/06/15   Dennie Bible, NP  FLUZONE HIGH-DOSE 0.5 ML SUSY  11/23/13   [provider]  lamoTRIgine (LAMICTAL) 25  MG tablet TAKE 2 TABLETS(50 MG) BY MOUTH DAILY 09/10/17   Hampton Abbot, MD  Melatonin 5 MG TABS Take 5 mg by mouth daily. May take up to 30-40 mg tablet daily    [provider]  Multiple Vitamins-Minerals (OCUVITE EXTRA PO) Take by mouth 2 (two) times daily.    [provider]  PROAIR HFA 108 (90 BASE) MCG/ACT inhaler As needed 12/02/13   [provider]  Respiratory Therapy Supplies (NEBULIZER) DEVI by Does not apply route as needed.    [provider]  SPIRIVA HANDIHALER 18 MCG inhalation capsule 1 capsule daily. 11/04/12   [provider]    Allergies    Codeine, Demerol [meperidine], Doxycycline, Effexor [venlafaxine hcl], Pneumovax [pneumococcal polysaccharide vaccine], and Sulfa antibiotics  Review of Systems   Review of Systems  Constitutional: Negative for appetite change and fever.  HENT: Negative for  congestion.   Respiratory: Negative for shortness of breath.   Cardiovascular: Positive for chest pain.  Gastrointestinal: Positive for abdominal pain.  Genitourinary: Negative for flank pain.  Musculoskeletal: Negative for back pain.       Right wrist pain.  Skin: Positive for wound.  Neurological: Positive for headaches.  Psychiatric/Behavioral: Negative for confusion.    Physical Exam Updated Vital Signs BP (!) 128/58   Pulse 80   Resp (!) 23   SpO2 97%   Physical Exam Vitals and nursing note reviewed.  HENT:     Head: Normocephalic.     Comments: Mild right-sided periorbital ecchymosis.  Eye movements intact. Eyes:     Pupils: Pupils are equal, round, and reactive to light.  Neck:     Comments: No midline tenderness. Cardiovascular:     Rate and Rhythm: Regular rhythm.  Pulmonary:     Breath sounds: Normal breath sounds.     Comments: tenderness to right lateral chest wall.  No crepitance or deformity. Chest:     Chest wall: Tenderness present.  Abdominal:     Tenderness: There is abdominal tenderness.     Comments: Right upper quadrant tenderness.  Musculoskeletal:     Cervical back: Neck supple.     Comments: Tenderness and swelling over right wrist laterally.  Skin intact.  Neurovascular intact in hand.  No tenderness over elbow or shoulder.  Skin:    General: Skin is warm.     Capillary Refill: Capillary refill takes less than 2 seconds.  Neurological:     Mental Status: She is alert and oriented to person, place, and time.     ED Results / Procedures / Treatments   Labs (all labs ordered are listed, but only abnormal results are displayed) Labs Reviewed  COMPREHENSIVE METABOLIC PANEL - Abnormal; Notable for the following components:      Result Value   Sodium 133 (*)    Glucose, Bld 118 (*)    Creatinine, Ser 1.08 (*)    GFR, Estimated 54 (*)    All other components within normal limits  CBC - Abnormal; Notable for the following components:    Hemoglobin 15.3 (*)    All other components within normal limits    EKG None  Radiology DG Ribs Unilateral W/Chest Right  Result Date: 06/09/2020 CLINICAL DATA:  Fall last night with right wrist swelling. Right rib pain. EXAM: RIGHT RIBS AND CHEST - 3+ VIEW COMPARISON:  Chest CT 01/15/2019 FINDINGS: Nondisplaced anterolateral right ninth rib fracture. No hemothorax or pneumothorax. Minimal scarring or atelectasis at the right base. Normal heart size and  mediastinal contours. IMPRESSION: 1. Suspected nondisplaced right ninth rib fracture. 2. No hemothorax or pneumothorax. Electronically Signed   By: Monte Fantasia M.D.   On: 06/09/2020 09:43   DG Wrist Complete Right  Result Date: 06/09/2020 CLINICAL DATA:  Fall last night.  Right wrist pain and swelling. EXAM: RIGHT WRIST - COMPLETE 3+ VIEW COMPARISON:  None. FINDINGS: There is an oblique, minimally displaced, intra-articular fracture of the distal radius involving the base of the styloid process. The distal ulna appears intact. There is no dislocation. The bones appear diffusely osteopenic. There is prominent soft tissue swelling about the wrist. IMPRESSION: Minimally displaced radial styloid fracture. Electronically Signed   By: Logan Bores M.D.   On: 06/09/2020 09:47   CT Head Wo Contrast  Result Date: 06/09/2020 CLINICAL DATA:  Fall EXAM: CT HEAD WITHOUT CONTRAST TECHNIQUE: Contiguous axial images were obtained from the base of the skull through the vertex without intravenous contrast. COMPARISON:  2014 FINDINGS: Brain: There is no acute intracranial hemorrhage, mass effect, or edema. Gray-white differentiation is preserved. There is no extra-axial fluid collection. Prominence of the ventricles and sulci reflects mild generalized parenchymal volume loss. Incidental note is made of cavum septum pellucidum et vergae. Vascular: No hyperdense vessel or unexpected calcification. Skull: Calvarium is unremarkable. Sinuses/Orbits: Right frontal sinus  opacification with chronic outflow occlusion. No acute orbital finding. Other: None. IMPRESSION: No evidence of acute intracranial injury. Chronic/nonemergent findings detailed above. Electronically Signed   By: Macy Mis M.D.   On: 06/09/2020 10:11   CT Chest W Contrast  Result Date: 06/09/2020 CLINICAL DATA:  Fall EXAM: CT CHEST, ABDOMEN, AND PELVIS WITH CONTRAST TECHNIQUE: Multidetector CT imaging of the chest, abdomen and pelvis was performed following the standard protocol during bolus administration of intravenous contrast. CONTRAST:  190mL OMNIPAQUE IOHEXOL 300 MG/ML  SOLN COMPARISON:  CT chest 01/15/2019 FINDINGS: CT CHEST FINDINGS Cardiovascular: Normal heart size. No pericardial effusion. Thoracic aorta is normal in caliber. Minimal calcified plaque along the aortic arch and descending thoracic aorta. Mediastinum/Nodes: No enlarged mediastinal or hilar lymph nodes. Bilateral axillary lymph nodes, with majority being nonenlarged. There are a few mildly enlarged nodes measuring up to 1.1 cm. However, these are also present on the prior chest CT. 1 cm right thyroid nodule for which no further follow-up is recommended by current guidelines. Esophagus is unremarkable. Lungs/Pleura: No pleural effusion or pneumothorax. Subsegmental scarring/atelectasis at the lung bases. Stable 4 mm right upper lobe subpleural nodule. Musculoskeletal: No chest wall hematoma.  No acute fracture. CT ABDOMEN PELVIS FINDINGS Hepatobiliary: No focal liver abnormality is seen. Status post cholecystectomy. No biliary dilatation. Pancreas: Unremarkable. Spleen: Unremarkable. Adrenals/Urinary Tract: Adrenals, kidneys, and bladder are unremarkable. Stomach/Bowel: Stomach is within normal limits. Bowel is normal in caliber. There are postoperative changes at the ileocolic junction probably reflecting partial colonic resection. Vascular/Lymphatic: Aortic atherosclerosis. No enlarged lymph nodes. Reproductive: Status post  hysterectomy. No adnexal masses. Other: No free fluid.  No abdominal wall hematoma. Musculoskeletal: No acute fracture. Vertebral body heights are stable. Partial fusion of the sacroiliac joints. IMPRESSION: No evidence of acute traumatic injury. Electronically Signed   By: Macy Mis M.D.   On: 06/09/2020 12:55   CT ABDOMEN PELVIS W CONTRAST  Result Date: 06/09/2020 CLINICAL DATA:  Fall EXAM: CT CHEST, ABDOMEN, AND PELVIS WITH CONTRAST TECHNIQUE: Multidetector CT imaging of the chest, abdomen and pelvis was performed following the standard protocol during bolus administration of intravenous contrast. CONTRAST:  172mL OMNIPAQUE IOHEXOL 300 MG/ML  SOLN COMPARISON:  CT chest 01/15/2019 FINDINGS: CT CHEST FINDINGS Cardiovascular: Normal heart size. No pericardial effusion. Thoracic aorta is normal in caliber. Minimal calcified plaque along the aortic arch and descending thoracic aorta. Mediastinum/Nodes: No enlarged mediastinal or hilar lymph nodes. Bilateral axillary lymph nodes, with majority being nonenlarged. There are a few mildly enlarged nodes measuring up to 1.1 cm. However, these are also present on the prior chest CT. 1 cm right thyroid nodule for which no further follow-up is recommended by current guidelines. Esophagus is unremarkable. Lungs/Pleura: No pleural effusion or pneumothorax. Subsegmental scarring/atelectasis at the lung bases. Stable 4 mm right upper lobe subpleural nodule. Musculoskeletal: No chest wall hematoma.  No acute fracture. CT ABDOMEN PELVIS FINDINGS Hepatobiliary: No focal liver abnormality is seen. Status post cholecystectomy. No biliary dilatation. Pancreas: Unremarkable. Spleen: Unremarkable. Adrenals/Urinary Tract: Adrenals, kidneys, and bladder are unremarkable. Stomach/Bowel: Stomach is within normal limits. Bowel is normal in caliber. There are postoperative changes at the ileocolic junction probably reflecting partial colonic resection. Vascular/Lymphatic: Aortic  atherosclerosis. No enlarged lymph nodes. Reproductive: Status post hysterectomy. No adnexal masses. Other: No free fluid.  No abdominal wall hematoma. Musculoskeletal: No acute fracture. Vertebral body heights are stable. Partial fusion of the sacroiliac joints. IMPRESSION: No evidence of acute traumatic injury. Electronically Signed   By: Macy Mis M.D.   On: 06/09/2020 12:55    Procedures Procedures   Medications Ordered in ED Medications  fentaNYL (SUBLIMAZE) injection 50 mcg (50 mcg Intravenous Given 06/09/20 1203)  iohexol (OMNIPAQUE) 300 MG/ML solution 100 mL (100 mLs Intravenous Contrast Given 06/09/20 1221)    ED Course  I have reviewed the triage vital signs and the nursing notes.  Pertinent labs & imaging results that were available during my care of the patient were reviewed by me and considered in my medical decision making (see chart for details).    MDM Rules/Calculators/A&P                          Patient with mechanical fall.  Right wrist pain right chest pain and hit head.  On anticoagulation.  Head CT reassuring.  Chest x-ray showed possible rib fracture.  However was tender over chest and abdomen.  CT scan done and reassuring.  Does have distal radius fracture.  Will have follow-up with hand surgery.  Immobilized in sugar tong.  I have reviewed the imaging and work-up. Final Clinical Impression(s) / ED Diagnoses Final diagnoses:  Fall, initial encounter  Closed fracture of distal end of right radius, unspecified fracture morphology, initial encounter  Blunt chest trauma, initial encounter    Rx / DC Orders ED Discharge Orders         Ordered    traMADol (ULTRAM) 50 MG tablet  Every 6 hours PRN        06/09/20 1405           Davonna Belling, MD 06/09/20 1622

## 2020-06-09 NOTE — ED Triage Notes (Signed)
Pt  Here from home with c/o fall last night walking back from mailbox , pt is c/o right wrist and rib pain , pt did hit her head is on plavix

## 2020-06-09 NOTE — ED Notes (Signed)
Ortho tech at bedside to apply splint.

## 2020-06-09 NOTE — Progress Notes (Signed)
Orthopedic Tech Progress Note Patient Details:  LEXXUS UNDERHILL 06-Jun-1945 [id number]  Ortho Devices Type of Ortho Device: Sugartong splint Ortho Device/Splint Location: RUE Ortho Device/Splint Interventions: Ordered,Application,Adjustment   Post Interventions Patient Tolerated: Well Instructions Provided: Care of device,[place] ambulation with device   Zaide Kardell 06/09/2020, 2:28 PM

## 2020-06-19 DIAGNOSIS — M25551 Pain in right hip: Secondary | ICD-10-CM | POA: Diagnosis not present

## 2020-06-19 DIAGNOSIS — M25531 Pain in right wrist: Secondary | ICD-10-CM | POA: Diagnosis not present

## 2020-06-19 DIAGNOSIS — R0781 Pleurodynia: Secondary | ICD-10-CM | POA: Diagnosis not present

## 2020-07-04 ENCOUNTER — Encounter: Admission: RE | Payer: Self-pay | Source: Home / Self Care

## 2020-07-04 ENCOUNTER — Ambulatory Visit: Admission: RE | Admit: 2020-07-04 | Payer: Medicare Other | Source: Home / Self Care

## 2020-07-04 SURGERY — EGD (ESOPHAGOGASTRODUODENOSCOPY)
Anesthesia: General

## 2020-07-05 DIAGNOSIS — I10 Essential (primary) hypertension: Secondary | ICD-10-CM | POA: Diagnosis not present

## 2020-07-05 DIAGNOSIS — J449 Chronic obstructive pulmonary disease, unspecified: Secondary | ICD-10-CM | POA: Diagnosis not present

## 2020-07-05 DIAGNOSIS — E7800 Pure hypercholesterolemia, unspecified: Secondary | ICD-10-CM | POA: Diagnosis not present

## 2020-07-05 DIAGNOSIS — N183 Chronic kidney disease, stage 3 unspecified: Secondary | ICD-10-CM | POA: Diagnosis not present

## 2020-07-05 DIAGNOSIS — M810 Age-related osteoporosis without current pathological fracture: Secondary | ICD-10-CM | POA: Diagnosis not present

## 2020-07-05 DIAGNOSIS — G459 Transient cerebral ischemic attack, unspecified: Secondary | ICD-10-CM | POA: Diagnosis not present

## 2020-07-17 DIAGNOSIS — M25531 Pain in right wrist: Secondary | ICD-10-CM | POA: Diagnosis not present

## 2020-08-16 DIAGNOSIS — H353221 Exudative age-related macular degeneration, left eye, with active choroidal neovascularization: Secondary | ICD-10-CM | POA: Diagnosis not present

## 2020-08-16 DIAGNOSIS — H353112 Nonexudative age-related macular degeneration, right eye, intermediate dry stage: Secondary | ICD-10-CM | POA: Diagnosis not present

## 2020-08-22 DIAGNOSIS — H2513 Age-related nuclear cataract, bilateral: Secondary | ICD-10-CM | POA: Diagnosis not present

## 2020-08-23 DIAGNOSIS — M25531 Pain in right wrist: Secondary | ICD-10-CM | POA: Diagnosis not present

## 2020-08-28 DIAGNOSIS — I10 Essential (primary) hypertension: Secondary | ICD-10-CM | POA: Diagnosis not present

## 2020-08-28 DIAGNOSIS — E7800 Pure hypercholesterolemia, unspecified: Secondary | ICD-10-CM | POA: Diagnosis not present

## 2020-08-28 DIAGNOSIS — J449 Chronic obstructive pulmonary disease, unspecified: Secondary | ICD-10-CM | POA: Diagnosis not present

## 2020-08-28 DIAGNOSIS — N183 Chronic kidney disease, stage 3 unspecified: Secondary | ICD-10-CM | POA: Diagnosis not present

## 2020-08-28 DIAGNOSIS — G459 Transient cerebral ischemic attack, unspecified: Secondary | ICD-10-CM | POA: Diagnosis not present

## 2020-08-28 DIAGNOSIS — M810 Age-related osteoporosis without current pathological fracture: Secondary | ICD-10-CM | POA: Diagnosis not present

## 2020-09-20 DIAGNOSIS — M810 Age-related osteoporosis without current pathological fracture: Secondary | ICD-10-CM | POA: Diagnosis not present

## 2020-09-20 DIAGNOSIS — N183 Chronic kidney disease, stage 3 unspecified: Secondary | ICD-10-CM | POA: Diagnosis not present

## 2020-09-20 DIAGNOSIS — G459 Transient cerebral ischemic attack, unspecified: Secondary | ICD-10-CM | POA: Diagnosis not present

## 2020-09-20 DIAGNOSIS — I10 Essential (primary) hypertension: Secondary | ICD-10-CM | POA: Diagnosis not present

## 2020-09-20 DIAGNOSIS — E7800 Pure hypercholesterolemia, unspecified: Secondary | ICD-10-CM | POA: Diagnosis not present

## 2020-09-20 DIAGNOSIS — J449 Chronic obstructive pulmonary disease, unspecified: Secondary | ICD-10-CM | POA: Diagnosis not present

## 2020-09-25 DIAGNOSIS — M25531 Pain in right wrist: Secondary | ICD-10-CM | POA: Diagnosis not present

## 2020-09-28 DIAGNOSIS — H353221 Exudative age-related macular degeneration, left eye, with active choroidal neovascularization: Secondary | ICD-10-CM | POA: Diagnosis not present

## 2020-09-28 DIAGNOSIS — H43813 Vitreous degeneration, bilateral: Secondary | ICD-10-CM | POA: Diagnosis not present

## 2020-09-28 DIAGNOSIS — H353112 Nonexudative age-related macular degeneration, right eye, intermediate dry stage: Secondary | ICD-10-CM | POA: Diagnosis not present

## 2020-09-28 DIAGNOSIS — H31092 Other chorioretinal scars, left eye: Secondary | ICD-10-CM | POA: Diagnosis not present

## 2020-10-24 DIAGNOSIS — I10 Essential (primary) hypertension: Secondary | ICD-10-CM | POA: Diagnosis not present

## 2020-10-24 DIAGNOSIS — G459 Transient cerebral ischemic attack, unspecified: Secondary | ICD-10-CM | POA: Diagnosis not present

## 2020-10-24 DIAGNOSIS — M810 Age-related osteoporosis without current pathological fracture: Secondary | ICD-10-CM | POA: Diagnosis not present

## 2020-10-24 DIAGNOSIS — N183 Chronic kidney disease, stage 3 unspecified: Secondary | ICD-10-CM | POA: Diagnosis not present

## 2020-10-24 DIAGNOSIS — J449 Chronic obstructive pulmonary disease, unspecified: Secondary | ICD-10-CM | POA: Diagnosis not present

## 2020-10-24 DIAGNOSIS — Z23 Encounter for immunization: Secondary | ICD-10-CM | POA: Diagnosis not present

## 2020-10-24 DIAGNOSIS — R7303 Prediabetes: Secondary | ICD-10-CM | POA: Diagnosis not present

## 2020-10-24 DIAGNOSIS — K219 Gastro-esophageal reflux disease without esophagitis: Secondary | ICD-10-CM | POA: Diagnosis not present

## 2020-10-24 DIAGNOSIS — E7800 Pure hypercholesterolemia, unspecified: Secondary | ICD-10-CM | POA: Diagnosis not present

## 2020-10-26 ENCOUNTER — Other Ambulatory Visit: Payer: Self-pay | Admitting: Internal Medicine

## 2020-10-26 DIAGNOSIS — M810 Age-related osteoporosis without current pathological fracture: Secondary | ICD-10-CM

## 2020-10-26 DIAGNOSIS — Z1231 Encounter for screening mammogram for malignant neoplasm of breast: Secondary | ICD-10-CM

## 2020-11-16 DIAGNOSIS — H353112 Nonexudative age-related macular degeneration, right eye, intermediate dry stage: Secondary | ICD-10-CM | POA: Diagnosis not present

## 2020-11-16 DIAGNOSIS — H353221 Exudative age-related macular degeneration, left eye, with active choroidal neovascularization: Secondary | ICD-10-CM | POA: Diagnosis not present

## 2021-01-04 DIAGNOSIS — H353112 Nonexudative age-related macular degeneration, right eye, intermediate dry stage: Secondary | ICD-10-CM | POA: Diagnosis not present

## 2021-01-04 DIAGNOSIS — H43813 Vitreous degeneration, bilateral: Secondary | ICD-10-CM | POA: Diagnosis not present

## 2021-01-04 DIAGNOSIS — H35033 Hypertensive retinopathy, bilateral: Secondary | ICD-10-CM | POA: Diagnosis not present

## 2021-01-04 DIAGNOSIS — H353221 Exudative age-related macular degeneration, left eye, with active choroidal neovascularization: Secondary | ICD-10-CM | POA: Diagnosis not present

## 2021-03-08 DIAGNOSIS — H353112 Nonexudative age-related macular degeneration, right eye, intermediate dry stage: Secondary | ICD-10-CM | POA: Diagnosis not present

## 2021-03-08 DIAGNOSIS — H353221 Exudative age-related macular degeneration, left eye, with active choroidal neovascularization: Secondary | ICD-10-CM | POA: Diagnosis not present

## 2021-03-08 DIAGNOSIS — H43393 Other vitreous opacities, bilateral: Secondary | ICD-10-CM | POA: Diagnosis not present

## 2021-03-08 DIAGNOSIS — H43813 Vitreous degeneration, bilateral: Secondary | ICD-10-CM | POA: Diagnosis not present

## 2021-03-19 DIAGNOSIS — M25572 Pain in left ankle and joints of left foot: Secondary | ICD-10-CM | POA: Diagnosis not present

## 2021-04-04 DIAGNOSIS — E7800 Pure hypercholesterolemia, unspecified: Secondary | ICD-10-CM | POA: Diagnosis not present

## 2021-04-04 DIAGNOSIS — Z1211 Encounter for screening for malignant neoplasm of colon: Secondary | ICD-10-CM | POA: Diagnosis not present

## 2021-04-04 DIAGNOSIS — Z0000 Encounter for general adult medical examination without abnormal findings: Secondary | ICD-10-CM | POA: Diagnosis not present

## 2021-04-04 DIAGNOSIS — M810 Age-related osteoporosis without current pathological fracture: Secondary | ICD-10-CM | POA: Diagnosis not present

## 2021-04-04 DIAGNOSIS — G459 Transient cerebral ischemic attack, unspecified: Secondary | ICD-10-CM | POA: Diagnosis not present

## 2021-04-04 DIAGNOSIS — R7303 Prediabetes: Secondary | ICD-10-CM | POA: Diagnosis not present

## 2021-04-04 DIAGNOSIS — J449 Chronic obstructive pulmonary disease, unspecified: Secondary | ICD-10-CM | POA: Diagnosis not present

## 2021-04-04 DIAGNOSIS — I10 Essential (primary) hypertension: Secondary | ICD-10-CM | POA: Diagnosis not present

## 2021-04-04 DIAGNOSIS — M5136 Other intervertebral disc degeneration, lumbar region: Secondary | ICD-10-CM | POA: Diagnosis not present

## 2021-04-04 DIAGNOSIS — E871 Hypo-osmolality and hyponatremia: Secondary | ICD-10-CM | POA: Diagnosis not present

## 2021-04-17 ENCOUNTER — Other Ambulatory Visit: Payer: Medicare Other

## 2021-04-17 ENCOUNTER — Ambulatory Visit: Payer: Medicare Other

## 2021-04-24 ENCOUNTER — Ambulatory Visit: Payer: Medicare Other

## 2021-05-14 DIAGNOSIS — H353221 Exudative age-related macular degeneration, left eye, with active choroidal neovascularization: Secondary | ICD-10-CM | POA: Diagnosis not present

## 2021-05-14 DIAGNOSIS — H31092 Other chorioretinal scars, left eye: Secondary | ICD-10-CM | POA: Diagnosis not present

## 2021-05-14 DIAGNOSIS — H35033 Hypertensive retinopathy, bilateral: Secondary | ICD-10-CM | POA: Diagnosis not present

## 2021-05-14 DIAGNOSIS — H353112 Nonexudative age-related macular degeneration, right eye, intermediate dry stage: Secondary | ICD-10-CM | POA: Diagnosis not present

## 2021-05-14 DIAGNOSIS — H43813 Vitreous degeneration, bilateral: Secondary | ICD-10-CM | POA: Diagnosis not present

## 2021-05-18 DIAGNOSIS — M5416 Radiculopathy, lumbar region: Secondary | ICD-10-CM | POA: Diagnosis not present

## 2021-05-23 DIAGNOSIS — Z86010 Personal history of colonic polyps: Secondary | ICD-10-CM | POA: Diagnosis not present

## 2021-05-24 DIAGNOSIS — M5416 Radiculopathy, lumbar region: Secondary | ICD-10-CM | POA: Diagnosis not present

## 2021-05-24 DIAGNOSIS — M48061 Spinal stenosis, lumbar region without neurogenic claudication: Secondary | ICD-10-CM | POA: Diagnosis not present

## 2021-05-24 DIAGNOSIS — M47816 Spondylosis without myelopathy or radiculopathy, lumbar region: Secondary | ICD-10-CM | POA: Diagnosis not present

## 2021-05-24 DIAGNOSIS — M5126 Other intervertebral disc displacement, lumbar region: Secondary | ICD-10-CM | POA: Diagnosis not present

## 2021-05-28 DIAGNOSIS — M6281 Muscle weakness (generalized): Secondary | ICD-10-CM | POA: Diagnosis not present

## 2021-05-28 DIAGNOSIS — M5416 Radiculopathy, lumbar region: Secondary | ICD-10-CM | POA: Diagnosis not present

## 2021-05-30 DIAGNOSIS — M899 Disorder of bone, unspecified: Secondary | ICD-10-CM | POA: Diagnosis not present

## 2021-05-31 DIAGNOSIS — M6281 Muscle weakness (generalized): Secondary | ICD-10-CM | POA: Diagnosis not present

## 2021-05-31 DIAGNOSIS — M5416 Radiculopathy, lumbar region: Secondary | ICD-10-CM | POA: Diagnosis not present

## 2021-06-04 DIAGNOSIS — M5416 Radiculopathy, lumbar region: Secondary | ICD-10-CM | POA: Diagnosis not present

## 2021-06-04 DIAGNOSIS — M6281 Muscle weakness (generalized): Secondary | ICD-10-CM | POA: Diagnosis not present

## 2021-06-07 DIAGNOSIS — M25551 Pain in right hip: Secondary | ICD-10-CM | POA: Diagnosis not present

## 2021-07-23 DIAGNOSIS — H353112 Nonexudative age-related macular degeneration, right eye, intermediate dry stage: Secondary | ICD-10-CM | POA: Diagnosis not present

## 2021-07-23 DIAGNOSIS — H353221 Exudative age-related macular degeneration, left eye, with active choroidal neovascularization: Secondary | ICD-10-CM | POA: Diagnosis not present

## 2021-07-23 DIAGNOSIS — H4322 Crystalline deposits in vitreous body, left eye: Secondary | ICD-10-CM | POA: Diagnosis not present

## 2021-07-23 DIAGNOSIS — H43813 Vitreous degeneration, bilateral: Secondary | ICD-10-CM | POA: Diagnosis not present

## 2021-07-24 ENCOUNTER — Ambulatory Visit
Admission: RE | Admit: 2021-07-24 | Discharge: 2021-07-24 | Disposition: A | Discharge status: Home / Self Care | Payer: Medicare Other | Source: Ambulatory Visit | Attending: Internal Medicine | Admitting: Internal Medicine

## 2021-07-24 ENCOUNTER — Other Ambulatory Visit: Payer: Self-pay | Admitting: Internal Medicine

## 2021-07-24 DIAGNOSIS — R600 Localized edema: Secondary | ICD-10-CM

## 2021-07-24 DIAGNOSIS — M25559 Pain in unspecified hip: Secondary | ICD-10-CM | POA: Diagnosis not present

## 2021-08-17 DIAGNOSIS — M25551 Pain in right hip: Secondary | ICD-10-CM | POA: Diagnosis not present

## 2021-08-30 DIAGNOSIS — M810 Age-related osteoporosis without current pathological fracture: Secondary | ICD-10-CM | POA: Diagnosis not present

## 2021-08-30 DIAGNOSIS — D125 Benign neoplasm of sigmoid colon: Secondary | ICD-10-CM | POA: Diagnosis not present

## 2021-08-30 DIAGNOSIS — K219 Gastro-esophageal reflux disease without esophagitis: Secondary | ICD-10-CM | POA: Diagnosis not present

## 2021-08-30 DIAGNOSIS — D124 Benign neoplasm of descending colon: Secondary | ICD-10-CM | POA: Diagnosis not present

## 2021-08-30 DIAGNOSIS — N183 Chronic kidney disease, stage 3 unspecified: Secondary | ICD-10-CM | POA: Diagnosis not present

## 2021-08-30 DIAGNOSIS — E7800 Pure hypercholesterolemia, unspecified: Secondary | ICD-10-CM | POA: Diagnosis not present

## 2021-08-30 DIAGNOSIS — I10 Essential (primary) hypertension: Secondary | ICD-10-CM | POA: Diagnosis not present

## 2021-08-30 DIAGNOSIS — Z09 Encounter for follow-up examination after completed treatment for conditions other than malignant neoplasm: Secondary | ICD-10-CM | POA: Diagnosis not present

## 2021-08-30 DIAGNOSIS — Z980 Intestinal bypass and anastomosis status: Secondary | ICD-10-CM | POA: Diagnosis not present

## 2021-08-30 DIAGNOSIS — D123 Benign neoplasm of transverse colon: Secondary | ICD-10-CM | POA: Diagnosis not present

## 2021-08-30 DIAGNOSIS — J449 Chronic obstructive pulmonary disease, unspecified: Secondary | ICD-10-CM | POA: Diagnosis not present

## 2021-08-30 DIAGNOSIS — Z86010 Personal history of colonic polyps: Secondary | ICD-10-CM | POA: Diagnosis not present

## 2021-09-03 DIAGNOSIS — M25551 Pain in right hip: Secondary | ICD-10-CM | POA: Diagnosis not present

## 2021-09-03 DIAGNOSIS — D125 Benign neoplasm of sigmoid colon: Secondary | ICD-10-CM | POA: Diagnosis not present

## 2021-09-03 DIAGNOSIS — D123 Benign neoplasm of transverse colon: Secondary | ICD-10-CM | POA: Diagnosis not present

## 2021-09-03 DIAGNOSIS — D124 Benign neoplasm of descending colon: Secondary | ICD-10-CM | POA: Diagnosis not present

## 2021-09-28 ENCOUNTER — Ambulatory Visit
Admission: RE | Admit: 2021-09-28 | Discharge: 2021-09-28 | Disposition: A | Discharge status: Home / Self Care | Payer: Medicare Other | Source: Ambulatory Visit | Attending: Internal Medicine | Admitting: Internal Medicine

## 2021-09-28 ENCOUNTER — Ambulatory Visit: Payer: Medicare Other

## 2021-09-28 DIAGNOSIS — H2513 Age-related nuclear cataract, bilateral: Secondary | ICD-10-CM | POA: Diagnosis not present

## 2021-09-28 DIAGNOSIS — M810 Age-related osteoporosis without current pathological fracture: Secondary | ICD-10-CM

## 2021-09-28 DIAGNOSIS — Z780 Asymptomatic menopausal state: Secondary | ICD-10-CM | POA: Diagnosis not present

## 2021-09-28 DIAGNOSIS — M85852 Other specified disorders of bone density and structure, left thigh: Secondary | ICD-10-CM | POA: Diagnosis not present

## 2021-10-02 ENCOUNTER — Ambulatory Visit
Admission: RE | Admit: 2021-10-02 | Discharge: 2021-10-02 | Disposition: A | Discharge status: Home / Self Care | Payer: Medicare Other | Source: Ambulatory Visit | Attending: Internal Medicine | Admitting: Internal Medicine

## 2021-10-02 DIAGNOSIS — J449 Chronic obstructive pulmonary disease, unspecified: Secondary | ICD-10-CM | POA: Diagnosis not present

## 2021-10-02 DIAGNOSIS — Z1231 Encounter for screening mammogram for malignant neoplasm of breast: Secondary | ICD-10-CM

## 2021-10-02 DIAGNOSIS — I10 Essential (primary) hypertension: Secondary | ICD-10-CM | POA: Diagnosis not present

## 2021-10-02 DIAGNOSIS — E7800 Pure hypercholesterolemia, unspecified: Secondary | ICD-10-CM | POA: Diagnosis not present

## 2021-10-02 DIAGNOSIS — R7303 Prediabetes: Secondary | ICD-10-CM | POA: Diagnosis not present

## 2021-10-02 DIAGNOSIS — F17210 Nicotine dependence, cigarettes, uncomplicated: Secondary | ICD-10-CM | POA: Diagnosis not present

## 2021-10-02 DIAGNOSIS — N183 Chronic kidney disease, stage 3 unspecified: Secondary | ICD-10-CM | POA: Diagnosis not present

## 2021-10-02 DIAGNOSIS — G459 Transient cerebral ischemic attack, unspecified: Secondary | ICD-10-CM | POA: Diagnosis not present

## 2021-10-03 ENCOUNTER — Other Ambulatory Visit: Payer: Self-pay | Admitting: Internal Medicine

## 2021-10-03 DIAGNOSIS — N644 Mastodynia: Secondary | ICD-10-CM

## 2021-10-04 DIAGNOSIS — H353221 Exudative age-related macular degeneration, left eye, with active choroidal neovascularization: Secondary | ICD-10-CM | POA: Diagnosis not present

## 2021-10-04 DIAGNOSIS — H4322 Crystalline deposits in vitreous body, left eye: Secondary | ICD-10-CM | POA: Diagnosis not present

## 2021-10-04 DIAGNOSIS — H353112 Nonexudative age-related macular degeneration, right eye, intermediate dry stage: Secondary | ICD-10-CM | POA: Diagnosis not present

## 2021-10-04 DIAGNOSIS — H43813 Vitreous degeneration, bilateral: Secondary | ICD-10-CM | POA: Diagnosis not present

## 2021-10-15 DIAGNOSIS — H353112 Nonexudative age-related macular degeneration, right eye, intermediate dry stage: Secondary | ICD-10-CM | POA: Diagnosis not present

## 2021-10-15 DIAGNOSIS — H353221 Exudative age-related macular degeneration, left eye, with active choroidal neovascularization: Secondary | ICD-10-CM | POA: Diagnosis not present

## 2021-10-18 DIAGNOSIS — M25551 Pain in right hip: Secondary | ICD-10-CM | POA: Diagnosis not present

## 2021-10-23 DIAGNOSIS — G459 Transient cerebral ischemic attack, unspecified: Secondary | ICD-10-CM | POA: Diagnosis not present

## 2021-10-23 DIAGNOSIS — M810 Age-related osteoporosis without current pathological fracture: Secondary | ICD-10-CM | POA: Diagnosis not present

## 2021-10-23 DIAGNOSIS — J449 Chronic obstructive pulmonary disease, unspecified: Secondary | ICD-10-CM | POA: Diagnosis not present

## 2021-10-23 DIAGNOSIS — M169 Osteoarthritis of hip, unspecified: Secondary | ICD-10-CM | POA: Diagnosis not present

## 2021-10-23 DIAGNOSIS — Z01818 Encounter for other preprocedural examination: Secondary | ICD-10-CM | POA: Diagnosis not present

## 2021-10-23 DIAGNOSIS — I10 Essential (primary) hypertension: Secondary | ICD-10-CM | POA: Diagnosis not present

## 2021-11-01 ENCOUNTER — Ambulatory Visit
Admission: RE | Admit: 2021-11-01 | Discharge: 2021-11-01 | Disposition: A | Discharge status: Home / Self Care | Payer: Medicare Other | Source: Ambulatory Visit | Attending: Internal Medicine | Admitting: Internal Medicine

## 2021-11-01 DIAGNOSIS — N644 Mastodynia: Secondary | ICD-10-CM

## 2021-11-01 DIAGNOSIS — R922 Inconclusive mammogram: Secondary | ICD-10-CM | POA: Diagnosis not present

## 2021-11-09 ENCOUNTER — Telehealth: Payer: Self-pay

## 2021-11-09 NOTE — Patient Outreach (Signed)
  Care Coordination   11/09/2021 Name: Caitlin Galloway MRN: [id number] DOB: [birth year]   Care Coordination Outreach Attempts:  An unsuccessful telephone outreach was attempted today to offer the patient information about available care coordination services as a benefit of their health plan.   Follow Up Plan:  Additional outreach attempts will be made to offer the patient care coordination information and services.   Encounter Outcome:  No Answer  Care Coordination Interventions Activated:  No   Care Coordination Interventions:  No, not indicated    [place] [facility] [phone]

## 2021-11-09 NOTE — Progress Notes (Signed)
Sent message, via epic in basket, requesting orders in epic from surgeon.  

## 2021-11-12 DIAGNOSIS — M25551 Pain in right hip: Secondary | ICD-10-CM | POA: Diagnosis not present

## 2021-11-14 NOTE — Patient Instructions (Addendum)
DUE TO COVID-19 ONLY TWO VISITORS  (aged 75 and older)  ARE ALLOWED TO COME WITH YOU AND STAY IN THE WAITING ROOM ONLY DURING PRE OP AND PROCEDURE.   **NO VISITORS ARE ALLOWED IN THE SHORT STAY AREA OR RECOVERY ROOM!!**  IF YOU WILL BE ADMITTED INTO THE HOSPITAL YOU ARE ALLOWED ONLY FOUR SUPPORT PEOPLE DURING VISITATION HOURS ONLY (7 AM -8PM)   The support person(s) must pass our screening, gel in and out, and wear a mask at all times, including in the patient's room. Patients must also wear a mask when staff or their support person are in the room. Visitors GUEST BADGE MUST BE WORN VISIBLY  One adult visitor may remain with you overnight and MUST be in the room by 8 P.M.     Your procedure is scheduled on: 11/28/21   Report to [facility] Main Entrance    Report to admitting at  7:50 AM   Call this number if you have problems the morning of surgery [phone]   Do not eat food :After Midnight.   After Midnight you may have the following liquids until __7:20____ AM/  DAY OF SURGERY  Water Black Coffee (sugar ok, NO MILK/CREAM OR CREAMERS)  Tea (sugar ok, NO MILK/CREAM OR CREAMERS) regular and decaf                             Plain Jell-O (NO RED)                                           Fruit ices (not with fruit pulp, NO RED)                                     Popsicles (NO RED)                                                                  Juice: apple, WHITE grape, WHITE cranberry Sports drinks like Gatorade (NO RED)                  The day of surgery:  Drink ONE (1) Pre-Surgery Clear Ensure at 7:00 AM the morning of surgery. Drink in one sitting. Do not sip.  This drink was given to you during your hospital  pre-op appointment visit. Nothing else to drink after completing the  Pre-Surgery Clear Ensure at 7:20 AM          If you have questions, please contact your surgeon's office.   FOLLOW BOWEL PREP AND ANY ADDITIONAL PRE OP INSTRUCTIONS YOU RECEIVED  FROM YOUR SURGEON'S OFFICE!!!     Oral Hygiene is also important to reduce your risk of infection.                                    Remember - BRUSH YOUR TEETH THE MORNING OF SURGERY WITH YOUR REGULAR TOOTHPASTE   Do NOT smoke after Midnight   Take these  medicines the morning of surgery with A SIP OF WATER: Lamotrigine, Bupropion, Atorvastatin, Amlodipine, Use your inhaler and bring it with you    Before surgery.Stop taking _Plavix__________on __9/22/23________as instructed by __Dr. Husain___________.   Bring CPAP mask and tubing day of surgery.                              You may not have any metal on your body including hair pins, jewelry, and body piercing             Do not wear make-up, lotions, powders, perfumes/cologne, or deodorant  Do not wear nail polish including gel and S&S, artificial/acrylic nails, or any other type of covering on natural nails including finger and toenails. If you have artificial nails, gel coating, etc. that needs to be removed by a nail salon please have this removed prior to surgery or surgery may need to be canceled/ delayed if the surgeon/ anesthesia feels like they are unable to be safely monitored.   Do not shave  48 hours prior to surgery.  .   Do not bring valuables to the hospital. [place].   Contacts, dentures or bridgework may not be worn into surgery.   Bring small overnight bag day of surgery.   DO NOT [place]. PHARMACY WILL DISPENSE MEDICATIONS LISTED ON YOUR MEDICATION LIST TO YOU DURING YOUR ADMISSION [place]!       Special Instructions: Bring a copy of your healthcare power of attorney and living will documents  the day of surgery if you haven't scanned them before.              Please read over the following fact sheets you were given: IF YOU HAVE QUESTIONS ABOUT YOUR PRE-OP INSTRUCTIONS PLEASE CALL [phone]     [facility] -  Preparing for Surgery Before surgery, you can play an important role.  Because skin is not sterile, your skin needs to be as free of germs as possible.  You can reduce the number of germs on your skin by washing with CHG (chlorahexidine gluconate) soap before surgery.  CHG is an antiseptic cleaner which kills germs and bonds with the skin to continue killing germs even after washing. Please DO NOT use if you have an allergy to CHG or antibacterial soaps.  If your skin becomes reddened/irritated stop using the CHG and inform your nurse when you arrive at Short Stay. Do not shave (including legs and underarms) for at least 48 hours prior to the first CHG shower.   Please follow these instructions carefully:  1.  Shower with CHG Soap the night before surgery and the  morning of Surgery.  2.  If you choose to wash your hair, wash your hair first as usual with your  normal  shampoo.  3.  After you shampoo, rinse your hair and body thoroughly to remove the  shampoo.                            4.  Use CHG as you would any other liquid soap.  You can apply chg directly  to the skin and wash  Gently with a scrungie or clean washcloth.  5.  Apply the CHG Soap to your body ONLY FROM THE NECK DOWN.   Do not use on face/ open                           Wound or open sores. Avoid contact with eyes, ears mouth and genitals (private parts).                       Wash face,  Genitals (private parts) with your normal soap.             6.  Wash thoroughly, paying special attention to the area where your surgery  will be performed.  7.  Thoroughly rinse your body with warm water from the neck down.  8.  DO NOT shower/wash with your normal soap after using and rinsing off  the CHG Soap.                9.  Pat yourself dry with a clean towel.            10.  Wear clean pajamas.            11.  Place clean sheets on your bed the night of your first shower and do not  sleep with pets. Day of Surgery  : Do not apply any lotions/deodorants the morning of surgery.  Please wear clean clothes to the hospital/surgery center.    ________________________________________________________________________   Incentive Spirometer  An incentive spirometer is a tool that can help keep your lungs clear and active. This tool measures how well you are filling your lungs with each breath. Taking long deep breaths may help reverse or decrease the chance of developing breathing (pulmonary) problems (especially infection) following: A long period of time when you are unable to move or be active. BEFORE THE PROCEDURE  If the spirometer includes an indicator to show your best effort, your nurse or respiratory therapist will set it to a desired goal. If possible, sit up straight or lean slightly forward. Try not to slouch. Hold the incentive spirometer in an upright position. INSTRUCTIONS FOR USE  Sit on the edge of your bed if possible, or sit up as far as you can in bed or on a chair. Hold the incentive spirometer in an upright position. Breathe out normally. Place the mouthpiece in your mouth and seal your lips tightly around it. Breathe in slowly and as deeply as possible, raising the piston or the ball toward the top of the column. Hold your breath for 3-5 seconds or for as long as possible. Allow the piston or ball to fall to the bottom of the column. Remove the mouthpiece from your mouth and breathe out normally. Rest for a few seconds and repeat Steps 1 through 7 at least 10 times every 1-2 hours when you are awake. Take your time and take a few normal breaths between deep breaths. The spirometer may include an indicator to show your best effort. Use the indicator as a goal to work toward during each repetition. After each set of 10 deep breaths, practice coughing to be sure your lungs are clear. If you have an incision (the cut made at the time of surgery), support your incision when coughing by placing  a pillow or rolled up towels firmly against it. Once you are able to get out of bed, walk around indoors and cough  well. You may stop using the incentive spirometer when instructed by your caregiver.  RISKS AND COMPLICATIONS Take your time so you do not get dizzy or light-headed. If you are in pain, you may need to take or ask for pain medication before doing incentive spirometry. It is harder to take a deep breath if you are having pain. AFTER USE Rest and breathe slowly and easily. It can be helpful to keep track of a log of your progress. Your caregiver can provide you with a simple table to help with this. If you are using the spirometer at home, follow these instructions: [place] IF:  You are having difficultly using the spirometer. You have trouble using the spirometer as often as instructed. Your pain medication is not giving enough relief while using the spirometer. You develop fever of 100.5 F (38.1 C) or higher. SEEK IMMEDIATE MEDICAL CARE IF:  You cough up bloody sputum that had not been present before. You develop fever of 102 F (38.9 C) or greater. You develop worsening pain at or near the incision site. MAKE SURE YOU:  Understand these instructions. Will watch your condition. Will get help right away if you are not doing well or get worse. Document Released: 07/01/2006 Document Revised: 05/13/2011 Document Reviewed: 09/01/2006 [facility] Patient Information 2014 [place], [place].   ________________________________________________________________________

## 2021-11-15 ENCOUNTER — Ambulatory Visit: Payer: Self-pay | Admitting: Physician Assistant

## 2021-11-15 DIAGNOSIS — G8929 Other chronic pain: Secondary | ICD-10-CM

## 2021-11-15 NOTE — H&P (View-Only) (Signed)
TOTAL HIP ADMISSION H&P  Patient is admitted for right total hip arthroplasty.  Subjective:  Chief Complaint: right hip pain  HPI: Caitlin Galloway, 76 y.o. female, has a history of pain and functional disability in the right hip(s) due to arthritis and patient has failed non-surgical conservative treatments for greater than 12 weeks to include NSAID's and/or analgesics, corticosteriod injections, use of assistive devices, and activity modification.  Onset of symptoms was gradual starting >10 years ago with gradually worsening course since that time.The patient noted no past surgery on the right hip(s).  Patient currently rates pain in the right hip at 8 out of 10 with activity. Patient has night pain, worsening of pain with activity and weight bearing, trendelenberg gait, pain that interfers with activities of daily living, and pain with passive range of motion. Patient has evidence of periarticular osteophytes and joint space narrowing by imaging studies. This condition presents safety issues increasing the risk of falls.  There is no current active infection.  Patient Active Problem List   Diagnosis Date Noted   Hyperlipemia 12/06/2015   HTN (hypertension) 12/06/2014   COPD exacerbation ([place]) 05/06/2012   Hypokalemia 05/06/2012   TIA (transient ischemic attack) 05/05/2012   Tobacco abuse 05/05/2012   Bipolar disorder, unspecified ([place]) 05/05/2012   Past Medical History:  Diagnosis Date   Anxiety    Cataracts, bilateral    slight   Cataracts, bilateral    COPD (chronic obstructive pulmonary disease) (HCC)    " mild "   Depression    GERD (gastroesophageal reflux disease)    Macular degeneration of both eyes    L mod, R mild   Mental disorder    bipolar   Shortness of breath    TIA (transient ischemic attack) 05/06/2012    Past Surgical History:  Procedure Laterality Date   ABDOMINAL HYSTERECTOMY     APPENDECTOMY     BREAST CYST ASPIRATION Right 1987   CARDIAC CATHETERIZATION      CHOLECYSTECTOMY     COLON SURGERY      Current Outpatient Medications  Medication Sig Dispense Refill Last Dose   acetaminophen (TYLENOL) 500 MG tablet Take 500 mg by mouth every 4 (four) hours.      albuterol (VENTOLIN HFA) 108 (90 Base) MCG/ACT inhaler Inhale 2 puffs into the lungs every 6 (six) hours as needed for wheezing or shortness of breath (Break thru shortness of breath).      alendronate (FOSAMAX) 70 MG tablet Take 70 mg by mouth once a week. Tuesday      amLODipine (NORVASC) 10 MG tablet Take 10 mg by mouth daily.      atorvastatin (LIPITOR) 20 MG tablet Take 1 tablet (20 mg total) by mouth daily.      b complex vitamins capsule Take 1 capsule by mouth daily.      buPROPion (WELLBUTRIN SR) 150 MG 12 hr tablet Take 300 mg by mouth daily.      CALTRATE 600+D 600-800 MG-UNIT TABS Take 1 tablet by mouth daily.  4    Cholecalciferol (VITAMIN D3) 125 MCG (5000 UT) TABS Take 5,000 Units by mouth daily.      clonazePAM (KLONOPIN) 1 MG tablet Take 1 tablet (1 mg) at bedtime (Patient taking differently: Take 2 mg by mouth at bedtime.) 30 tablet 0    clopidogrel (PLAVIX) 75 MG tablet Take 1 tablet (75 mg total) by mouth daily. 90 tablet 1    Melatonin 10 MG TABS Take 40 mg by mouth  at bedtime.      Multiple Vitamins-Minerals (PRESERVISION AREDS 2) CAPS Take 1 tablet by mouth 2 (two) times daily.      potassium chloride (KLOR-CON) 10 MEQ tablet Take 10 mEq by mouth daily as needed (with Torosamide). with food      Respiratory Therapy Supplies (NEBULIZER) DEVI by Does not apply route as needed.      torsemide (DEMADEX) 10 MG tablet Take 10 mg by mouth daily as needed for edema.      traMADol (ULTRAM) 50 MG tablet Take 1 tablet (50 mg total) by mouth every 6 (six) hours as needed. (Patient taking differently: Take 50 mg by mouth every 8 (eight) hours as needed for moderate pain or severe pain.) 8 tablet 0    TRELEGY ELLIPTA 200-62.5-25 MCG/ACT AEPB Inhale 1 puff into the lungs daily.       No current facility-administered medications for this visit.   Allergies  Allergen Reactions   Codeine Nausea And Vomiting   Demerol [Meperidine] Nausea And Vomiting   Effexor [Venlafaxine Hcl] Other (See Comments)    Increase pressure   Doxycycline Rash   Pneumovax [Pneumococcal Polysaccharide Vaccine] Rash   Sulfa Antibiotics Rash    Social History   Tobacco Use   Smoking status: Every Day    Packs/day: 2.00    Years: 45.00    Total pack years: 90.00    Types: Cigarettes   Smokeless tobacco: Never  Substance Use Topics   Alcohol use: Yes    Alcohol/week: 1.0 standard drink of alcohol    Types: 1 Glasses of wine per week    Comment: occassional     Family History  Problem Relation Age of Onset   COPD Father    Stroke Father    Bipolar disorder Father    Alcohol abuse Father    Breast cancer Sister 77   Bipolar disorder Maternal Aunt    Breast cancer Paternal Aunt 3   Breast cancer Paternal Aunt 32   Breast cancer Paternal Aunt 3     Review of Systems  HENT:  Positive for tinnitus.   Cardiovascular:  Positive for leg swelling.  Musculoskeletal:  Positive for arthralgias.  Psychiatric/Behavioral:  The patient is nervous/anxious.   All other systems reviewed and are negative.   Objective:  Physical Exam Constitutional:      General: She is not in acute distress.    Appearance: Normal appearance.  HENT:     Head: Normocephalic and atraumatic.  Eyes:     Extraocular Movements: Extraocular movements intact.     Pupils: Pupils are equal, round, and reactive to light.  Cardiovascular:     Rate and Rhythm: Normal rate and regular rhythm.     Pulses: Normal pulses.     Heart sounds: Normal heart sounds.  Pulmonary:     Effort: Pulmonary effort is normal.     Breath sounds: Normal breath sounds.  Abdominal:     General: Abdomen is flat. Bowel sounds are normal. There is no distension.     Palpations: Abdomen is soft.     Tenderness: There is no  abdominal tenderness.  Musculoskeletal:     Cervical back: Normal range of motion and neck supple.     Comments: Examination of the right lower extremity shows she is neurovascularly intact.  Intact dorsiflexion and plantarflexion with the ankle.  There is no calf tenderness to palpation.  She does have some mild swelling or edema noted throughout bilateral foot and ankle area.  Lymphadenopathy:     Cervical: No cervical adenopathy.  Skin:    General: Skin is warm and dry.     Findings: No erythema or rash.  Neurological:     General: No focal deficit present.     Mental Status: She is alert and oriented to person, place, and time.  Psychiatric:        Mood and Affect: Mood normal.        Behavior: Behavior normal.     Vital signs in last 24 hours: '@VSRANGES'$ @  Labs:   Estimated body mass index is 30.68 kg/m as calculated from the following:   Height as of 07/08/17: 5' 6.5" (1.689 m).   Weight as of 07/08/17: 87.5 kg.   Imaging Review Plain radiographs demonstrate moderate degenerative joint disease of the right hip(s). The bone quality appears to be good for age and reported activity level.      Assessment/Plan:  End stage arthritis, right hip(s)  The patient history, physical examination, clinical judgement of the provider and imaging studies are consistent with end stage degenerative joint disease of the right hip(s) and total hip arthroplasty is deemed medically necessary. The treatment options including medical management, injection therapy, arthroscopy and arthroplasty were discussed at length. The risks and benefits of total hip arthroplasty were presented and reviewed. The risks due to aseptic loosening, infection, stiffness, dislocation/subluxation,  thromboembolic complications and other imponderables were discussed.  The patient acknowledged the explanation, agreed to proceed with the plan and consent was signed. Patient is being admitted for inpatient treatment for  surgery, pain control, PT, OT, prophylactic antibiotics, VTE prophylaxis, progressive ambulation and ADL's and discharge planning.The patient is planning to be discharged  home with outpt PT   Anticipated LOS equal to or greater than 2 midnights due to - Age 34 and older with one or more of the following:  - Obesity  - Expected need for hospital services (PT, OT, Nursing) required for safe  discharge  - Anticipated need for postoperative skilled nursing care or inpatient rehab  - Active co-morbidities: Stroke and Respiratory Failure/COPD OR   - Unanticipated findings during/Post Surgery: None  - Patient is a high risk of re-admission due to: None

## 2021-11-15 NOTE — H&P (Signed)
TOTAL HIP ADMISSION H&P  Patient is admitted for right total hip arthroplasty.  Subjective:  Chief Complaint: right hip pain  HPI: Caitlin Galloway, 76 y.o. female, has a history of pain and functional disability in the right hip(s) due to arthritis and patient has failed non-surgical conservative treatments for greater than 12 weeks to include NSAID's and/or analgesics, corticosteriod injections, use of assistive devices, and activity modification.  Onset of symptoms was gradual starting >10 years ago with gradually worsening course since that time.The patient noted no past surgery on the right hip(s).  Patient currently rates pain in the right hip at 8 out of 10 with activity. Patient has night pain, worsening of pain with activity and weight bearing, trendelenberg gait, pain that interfers with activities of daily living, and pain with passive range of motion. Patient has evidence of periarticular osteophytes and joint space narrowing by imaging studies. This condition presents safety issues increasing the risk of falls.  There is no current active infection.  Patient Active Problem List   Diagnosis Date Noted   Hyperlipemia 12/06/2015   HTN (hypertension) 12/06/2014   COPD exacerbation ([place]) 05/06/2012   Hypokalemia 05/06/2012   TIA (transient ischemic attack) 05/05/2012   Tobacco abuse 05/05/2012   Bipolar disorder, unspecified ([place]) 05/05/2012   Past Medical History:  Diagnosis Date   Anxiety    Cataracts, bilateral    slight   Cataracts, bilateral    COPD (chronic obstructive pulmonary disease) (HCC)    " mild "   Depression    GERD (gastroesophageal reflux disease)    Macular degeneration of both eyes    L mod, R mild   Mental disorder    bipolar   Shortness of breath    TIA (transient ischemic attack) 05/06/2012    Past Surgical History:  Procedure Laterality Date   ABDOMINAL HYSTERECTOMY     APPENDECTOMY     BREAST CYST ASPIRATION Right 1987   CARDIAC CATHETERIZATION      CHOLECYSTECTOMY     COLON SURGERY      Current Outpatient Medications  Medication Sig Dispense Refill Last Dose   acetaminophen (TYLENOL) 500 MG tablet Take 500 mg by mouth every 4 (four) hours.      albuterol (VENTOLIN HFA) 108 (90 Base) MCG/ACT inhaler Inhale 2 puffs into the lungs every 6 (six) hours as needed for wheezing or shortness of breath (Break thru shortness of breath).      alendronate (FOSAMAX) 70 MG tablet Take 70 mg by mouth once a week. Tuesday      amLODipine (NORVASC) 10 MG tablet Take 10 mg by mouth daily.      atorvastatin (LIPITOR) 20 MG tablet Take 1 tablet (20 mg total) by mouth daily.      b complex vitamins capsule Take 1 capsule by mouth daily.      buPROPion (WELLBUTRIN SR) 150 MG 12 hr tablet Take 300 mg by mouth daily.      CALTRATE 600+D 600-800 MG-UNIT TABS Take 1 tablet by mouth daily.  4    Cholecalciferol (VITAMIN D3) 125 MCG (5000 UT) TABS Take 5,000 Units by mouth daily.      clonazePAM (KLONOPIN) 1 MG tablet Take 1 tablet (1 mg) at bedtime (Patient taking differently: Take 2 mg by mouth at bedtime.) 30 tablet 0    clopidogrel (PLAVIX) 75 MG tablet Take 1 tablet (75 mg total) by mouth daily. 90 tablet 1    Melatonin 10 MG TABS Take 40 mg by mouth  at bedtime.      Multiple Vitamins-Minerals (PRESERVISION AREDS 2) CAPS Take 1 tablet by mouth 2 (two) times daily.      potassium chloride (KLOR-CON) 10 MEQ tablet Take 10 mEq by mouth daily as needed (with Torosamide). with food      Respiratory Therapy Supplies (NEBULIZER) DEVI by Does not apply route as needed.      torsemide (DEMADEX) 10 MG tablet Take 10 mg by mouth daily as needed for edema.      traMADol (ULTRAM) 50 MG tablet Take 1 tablet (50 mg total) by mouth every 6 (six) hours as needed. (Patient taking differently: Take 50 mg by mouth every 8 (eight) hours as needed for moderate pain or severe pain.) 8 tablet 0    TRELEGY ELLIPTA 200-62.5-25 MCG/ACT AEPB Inhale 1 puff into the lungs daily.       No current facility-administered medications for this visit.   Allergies  Allergen Reactions   Codeine Nausea And Vomiting   Demerol [Meperidine] Nausea And Vomiting   Effexor [Venlafaxine Hcl] Other (See Comments)    Increase pressure   Doxycycline Rash   Pneumovax [Pneumococcal Polysaccharide Vaccine] Rash   Sulfa Antibiotics Rash    Social History   Tobacco Use   Smoking status: Every Day    Packs/day: 2.00    Years: 45.00    Total pack years: 90.00    Types: Cigarettes   Smokeless tobacco: Never  Substance Use Topics   Alcohol use: Yes    Alcohol/week: 1.0 standard drink of alcohol    Types: 1 Glasses of wine per week    Comment: occassional     Family History  Problem Relation Age of Onset   COPD Father    Stroke Father    Bipolar disorder Father    Alcohol abuse Father    Breast cancer Sister 57   Bipolar disorder Maternal Aunt    Breast cancer Paternal Aunt 3   Breast cancer Paternal Aunt 67   Breast cancer Paternal Aunt 32     Review of Systems  HENT:  Positive for tinnitus.   Cardiovascular:  Positive for leg swelling.  Musculoskeletal:  Positive for arthralgias.  Psychiatric/Behavioral:  The patient is nervous/anxious.   All other systems reviewed and are negative.   Objective:  Physical Exam Constitutional:      General: She is not in acute distress.    Appearance: Normal appearance.  HENT:     Head: Normocephalic and atraumatic.  Eyes:     Extraocular Movements: Extraocular movements intact.     Pupils: Pupils are equal, round, and reactive to light.  Cardiovascular:     Rate and Rhythm: Normal rate and regular rhythm.     Pulses: Normal pulses.     Heart sounds: Normal heart sounds.  Pulmonary:     Effort: Pulmonary effort is normal.     Breath sounds: Normal breath sounds.  Abdominal:     General: Abdomen is flat. Bowel sounds are normal. There is no distension.     Palpations: Abdomen is soft.     Tenderness: There is no  abdominal tenderness.  Musculoskeletal:     Cervical back: Normal range of motion and neck supple.     Comments: Examination of the right lower extremity shows she is neurovascularly intact.  Intact dorsiflexion and plantarflexion with the ankle.  There is no calf tenderness to palpation.  She does have some mild swelling or edema noted throughout bilateral foot and ankle area.  Lymphadenopathy:     Cervical: No cervical adenopathy.  Skin:    General: Skin is warm and dry.     Findings: No erythema or rash.  Neurological:     General: No focal deficit present.     Mental Status: She is alert and oriented to person, place, and time.  Psychiatric:        Mood and Affect: Mood normal.        Behavior: Behavior normal.     Vital signs in last 24 hours: '@VSRANGES'$ @  Labs:   Estimated body mass index is 30.68 kg/m as calculated from the following:   Height as of 07/08/17: 5' 6.5" (1.689 m).   Weight as of 07/08/17: 87.5 kg.   Imaging Review Plain radiographs demonstrate moderate degenerative joint disease of the right hip(s). The bone quality appears to be good for age and reported activity level.      Assessment/Plan:  End stage arthritis, right hip(s)  The patient history, physical examination, clinical judgement of the provider and imaging studies are consistent with end stage degenerative joint disease of the right hip(s) and total hip arthroplasty is deemed medically necessary. The treatment options including medical management, injection therapy, arthroscopy and arthroplasty were discussed at length. The risks and benefits of total hip arthroplasty were presented and reviewed. The risks due to aseptic loosening, infection, stiffness, dislocation/subluxation,  thromboembolic complications and other imponderables were discussed.  The patient acknowledged the explanation, agreed to proceed with the plan and consent was signed. Patient is being admitted for inpatient treatment for  surgery, pain control, PT, OT, prophylactic antibiotics, VTE prophylaxis, progressive ambulation and ADL's and discharge planning.The patient is planning to be discharged  home with outpt PT   Anticipated LOS equal to or greater than 2 midnights due to - Age 24 and older with one or more of the following:  - Obesity  - Expected need for hospital services (PT, OT, Nursing) required for safe  discharge  - Anticipated need for postoperative skilled nursing care or inpatient rehab  - Active co-morbidities: Stroke and Respiratory Failure/COPD OR   - Unanticipated findings during/Post Surgery: None  - Patient is a high risk of re-admission due to: None

## 2021-11-16 ENCOUNTER — Encounter (HOSPITAL_COMMUNITY)
Admission: RE | Admit: 2021-11-16 | Discharge: 2021-11-16 | Disposition: A | Discharge status: Home / Self Care | Payer: Medicare Other | Source: Ambulatory Visit | Attending: Orthopedic Surgery | Admitting: Orthopedic Surgery

## 2021-11-16 ENCOUNTER — Other Ambulatory Visit: Payer: Self-pay

## 2021-11-16 ENCOUNTER — Encounter (HOSPITAL_COMMUNITY): Payer: Self-pay

## 2021-11-16 DIAGNOSIS — M25551 Pain in right hip: Secondary | ICD-10-CM | POA: Insufficient documentation

## 2021-11-16 DIAGNOSIS — M1611 Unilateral primary osteoarthritis, right hip: Secondary | ICD-10-CM | POA: Insufficient documentation

## 2021-11-16 DIAGNOSIS — Z01818 Encounter for other preprocedural examination: Secondary | ICD-10-CM

## 2021-11-16 DIAGNOSIS — I10 Essential (primary) hypertension: Secondary | ICD-10-CM | POA: Diagnosis not present

## 2021-11-16 DIAGNOSIS — G8929 Other chronic pain: Secondary | ICD-10-CM | POA: Diagnosis not present

## 2021-11-16 HISTORY — DX: Essential (primary) hypertension: I10

## 2021-11-16 HISTORY — DX: Chronic kidney disease, unspecified: N18.9

## 2021-11-16 HISTORY — DX: Unspecified osteoarthritis, unspecified site: M19.90

## 2021-11-16 HISTORY — DX: Bipolar II disorder: F31.81

## 2021-11-16 LAB — COMPREHENSIVE METABOLIC PANEL
ALT: 23 U/L (ref 0–44)
AST: 24 U/L (ref 15–41)
Albumin: 4.5 g/dL (ref 3.5–5.0)
Alkaline Phosphatase: 126 U/L (ref 38–126)
Anion gap: 9 (ref 5–15)
BUN: 9 mg/dL (ref 8–23)
CO2: 29 mmol/L (ref 22–32)
Calcium: 9.8 mg/dL (ref 8.9–10.3)
Chloride: 101 mmol/L (ref 98–111)
Creatinine, Ser: 1 mg/dL (ref 0.44–1.00)
GFR, Estimated: 59 mL/min — ABNORMAL LOW (ref >60)
Glucose, Bld: 108 mg/dL — ABNORMAL HIGH (ref 70–99)
Potassium: 3.5 mmol/L (ref 3.5–5.1)
Sodium: 139 mmol/L (ref 135–145)
Total Bilirubin: 0.6 mg/dL (ref 0.3–1.2)
Total Protein: 7.2 g/dL (ref 6.5–8.1)

## 2021-11-16 LAB — SURGICAL PCR SCREEN
MRSA, PCR: NEGATIVE
Staphylococcus aureus: NEGATIVE

## 2021-11-16 LAB — CBC WITH DIFFERENTIAL/PLATELET
Abs Immature Granulocytes: 0.02 10*3/uL (ref 0.00–0.07)
Basophils Absolute: 0.1 10*3/uL (ref 0.0–0.1)
Basophils Relative: 1 %
Eosinophils Absolute: 0 10*3/uL (ref 0.0–0.5)
Eosinophils Relative: 1 %
HCT: 45.2 % (ref 36.0–46.0)
Hemoglobin: 15.5 g/dL — ABNORMAL HIGH (ref 12.0–15.0)
Immature Granulocytes: 0 %
Lymphocytes Relative: 23 %
Lymphs Abs: 1.7 10*3/uL (ref 0.7–4.0)
MCH: 32.7 pg (ref 26.0–34.0)
MCHC: 34.3 g/dL (ref 30.0–36.0)
MCV: 95.4 fL (ref 80.0–100.0)
Monocytes Absolute: 0.5 10*3/uL (ref 0.1–1.0)
Monocytes Relative: 7 %
Neutro Abs: 5.4 10*3/uL (ref 1.7–7.7)
Neutrophils Relative %: 68 %
Platelets: 296 10*3/uL (ref 150–400)
RBC: 4.74 MIL/uL (ref 3.87–5.11)
RDW: 12.7 % (ref 11.5–15.5)
WBC: 7.7 10*3/uL (ref 4.0–10.5)
nRBC: 0 % (ref 0.0–0.2)

## 2021-11-16 LAB — TYPE AND SCREEN
ABO/RH(D): A POS
Antibody Screen: NEGATIVE

## 2021-11-16 NOTE — Progress Notes (Signed)
Anesthesia note:  Bowel prep reminder:NA  PCP - Dr. Tommie Ard Cardiologist -none Other-   Chest x-ray - no EKG - 10/23/21-chart Stress Test - no ECHO - 2018-epic Cardiac Cath - 1995  Pacemaker/ICD device last checked:NA  Sleep Study - no CPAP -   Pt is pre diabetic-NA Fasting Blood Sugar -  Checks Blood Sugar _____  Blood Thinner:Plavix/ for TIA/ Dr. Thurman Coyer Blood Thinner Instructions:stop 5 days/ Dr. Thurman Coyer Aspirin Instructions: Last Dose:11/22/21  Anesthesia review: yes  Patient denies shortness of breath, fever, cough and chest pain at PAT appointment Pt smokes a pack a day. She is SOB with most activities.  Patient verbalized understanding of instructions that were given to them at the PAT appointment. Patient was also instructed that they will need to review over the PAT instructions again at home before surgery. yes

## 2021-11-23 NOTE — Care Plan (Signed)
Ortho Bundle Case Management Note  Patient Details  Name: Caitlin Galloway MRN: [id number] Date of Birth: June 13, 1945    met with patient in the office for H&P. will discharge to home with family to assist. has equipment. OPPT set up with [facility]. discharge instructions discussed and questions answered. appointments confirmed.  Patient and MD in agreement. Choice offered         DME Arranged:    DME Agency:     HH Arranged:    HH Agency:     Additional Comments: Please contact me with any questions of if this plan should need to change.  Ladell Heads,  [place] Specialist  [phone] 11/23/2021, 11:11 AM

## 2021-11-28 ENCOUNTER — Ambulatory Visit (HOSPITAL_COMMUNITY): Payer: Medicare Other

## 2021-11-28 ENCOUNTER — Encounter (HOSPITAL_COMMUNITY)
Admission: RE | Disposition: A | Discharge status: Home / Self Care | Payer: Self-pay | Source: Home / Self Care | Attending: Orthopedic Surgery

## 2021-11-28 ENCOUNTER — Encounter (HOSPITAL_COMMUNITY): Payer: Self-pay | Admitting: Orthopedic Surgery

## 2021-11-28 ENCOUNTER — Other Ambulatory Visit: Payer: Self-pay

## 2021-11-28 ENCOUNTER — Observation Stay (HOSPITAL_COMMUNITY)
Admission: RE | Admit: 2021-11-28 | Discharge: 2021-11-29 | Disposition: A | Discharge status: Home / Self Care | Payer: Medicare Other | Attending: Orthopedic Surgery | Admitting: Orthopedic Surgery

## 2021-11-28 ENCOUNTER — Ambulatory Visit (HOSPITAL_BASED_OUTPATIENT_CLINIC_OR_DEPARTMENT_OTHER): Payer: Medicare Other | Admitting: Certified Registered"

## 2021-11-28 ENCOUNTER — Ambulatory Visit (HOSPITAL_COMMUNITY): Payer: Medicare Other | Admitting: Certified Registered"

## 2021-11-28 ENCOUNTER — Observation Stay (HOSPITAL_COMMUNITY): Payer: Medicare Other

## 2021-11-28 DIAGNOSIS — Z7902 Long term (current) use of antithrombotics/antiplatelets: Secondary | ICD-10-CM | POA: Insufficient documentation

## 2021-11-28 DIAGNOSIS — J449 Chronic obstructive pulmonary disease, unspecified: Secondary | ICD-10-CM | POA: Diagnosis not present

## 2021-11-28 DIAGNOSIS — I10 Essential (primary) hypertension: Secondary | ICD-10-CM

## 2021-11-28 DIAGNOSIS — Z8673 Personal history of transient ischemic attack (TIA), and cerebral infarction without residual deficits: Secondary | ICD-10-CM | POA: Insufficient documentation

## 2021-11-28 DIAGNOSIS — M1611 Unilateral primary osteoarthritis, right hip: Secondary | ICD-10-CM | POA: Diagnosis not present

## 2021-11-28 DIAGNOSIS — I129 Hypertensive chronic kidney disease with stage 1 through stage 4 chronic kidney disease, or unspecified chronic kidney disease: Secondary | ICD-10-CM | POA: Diagnosis not present

## 2021-11-28 DIAGNOSIS — F17210 Nicotine dependence, cigarettes, uncomplicated: Secondary | ICD-10-CM | POA: Diagnosis not present

## 2021-11-28 DIAGNOSIS — Z01818 Encounter for other preprocedural examination: Secondary | ICD-10-CM

## 2021-11-28 DIAGNOSIS — Z79899 Other long term (current) drug therapy: Secondary | ICD-10-CM | POA: Insufficient documentation

## 2021-11-28 DIAGNOSIS — N189 Chronic kidney disease, unspecified: Secondary | ICD-10-CM | POA: Diagnosis not present

## 2021-11-28 DIAGNOSIS — Z96641 Presence of right artificial hip joint: Secondary | ICD-10-CM | POA: Diagnosis not present

## 2021-11-28 DIAGNOSIS — Z471 Aftercare following joint replacement surgery: Secondary | ICD-10-CM | POA: Diagnosis not present

## 2021-11-28 DIAGNOSIS — M1612 Unilateral primary osteoarthritis, left hip: Secondary | ICD-10-CM | POA: Diagnosis not present

## 2021-11-28 HISTORY — PX: TOTAL HIP ARTHROPLASTY: SHX124

## 2021-11-28 LAB — ABO/RH: ABO/RH(D): A POS

## 2021-11-28 SURGERY — ARTHROPLASTY, HIP, TOTAL,POSTERIOR APPROACH
Anesthesia: General | Site: Hip | Laterality: Right

## 2021-11-28 MED ORDER — AMLODIPINE BESYLATE 10 MG PO TABS
10.0000 mg | ORAL_TABLET | Freq: Every day | ORAL | Status: DC
  Administered 2021-11-29: 10 mg via ORAL
  Filled 2021-11-28: qty 1

## 2021-11-28 MED ORDER — CLOPIDOGREL BISULFATE 75 MG PO TABS: 75.0000 mg | ORAL_TABLET | Freq: Every day | ORAL | 1 refills | Status: AC

## 2021-11-28 MED ORDER — DEXAMETHASONE SODIUM PHOSPHATE 10 MG/ML IJ SOLN
8.0000 mg | Freq: Once | INTRAMUSCULAR | Status: AC
  Administered 2021-11-28: 8 mg via INTRAVENOUS

## 2021-11-28 MED ORDER — POTASSIUM CHLORIDE ER 10 MEQ PO TBCR: 10.0000 meq | EXTENDED_RELEASE_TABLET | Freq: Every day | ORAL | Status: DC | PRN

## 2021-11-28 MED ORDER — DOCUSATE SODIUM 100 MG PO CAPS
100.0000 mg | ORAL_CAPSULE | Freq: Two times a day (BID) | ORAL | Status: DC
  Administered 2021-11-28 – 2021-11-29 (×2): 100 mg via ORAL
  Filled 2021-11-28 (×2): qty 1

## 2021-11-28 MED ORDER — WATER FOR IRRIGATION, STERILE IR SOLN
Status: DC | PRN
  Administered 2021-11-28: 2000 mL

## 2021-11-28 MED ORDER — VITAMIN D 25 MCG (1000 UNIT) PO TABS
5000.0000 [IU] | ORAL_TABLET | Freq: Every day | ORAL | Status: DC
  Administered 2021-11-29: 5000 [IU] via ORAL
  Filled 2021-11-28: qty 5

## 2021-11-28 MED ORDER — ACETAMINOPHEN 325 MG PO TABS
325.0000 mg | ORAL_TABLET | Freq: Four times a day (QID) | ORAL | Status: DC | PRN
  Administered 2021-11-28: 650 mg via ORAL

## 2021-11-28 MED ORDER — ORAL CARE MOUTH RINSE: 15.0000 mL | Freq: Once | OROMUCOSAL | Status: AC

## 2021-11-28 MED ORDER — MIDAZOLAM HCL 2 MG/2ML IJ SOLN
INTRAMUSCULAR | Status: AC
  Filled 2021-11-28: qty 2

## 2021-11-28 MED ORDER — TRANEXAMIC ACID 1000 MG/10ML IV SOLN
2000.0000 mg | INTRAVENOUS | Status: DC
  Filled 2021-11-28: qty 20

## 2021-11-28 MED ORDER — MIDAZOLAM HCL 5 MG/5ML IJ SOLN
INTRAMUSCULAR | Status: DC | PRN
  Administered 2021-11-28: 2 mg via INTRAVENOUS

## 2021-11-28 MED ORDER — BUPROPION HCL ER (SR) 150 MG PO TB12
300.0000 mg | ORAL_TABLET | Freq: Every day | ORAL | Status: DC
  Administered 2021-11-29: 300 mg via ORAL
  Filled 2021-11-28: qty 2

## 2021-11-28 MED ORDER — ATORVASTATIN CALCIUM 20 MG PO TABS
20.0000 mg | ORAL_TABLET | Freq: Every day | ORAL | Status: DC
  Administered 2021-11-29: 20 mg via ORAL
  Filled 2021-11-28: qty 1

## 2021-11-28 MED ORDER — PANTOPRAZOLE SODIUM 40 MG PO TBEC
40.0000 mg | DELAYED_RELEASE_TABLET | Freq: Every day | ORAL | Status: DC
  Administered 2021-11-28 – 2021-11-29 (×2): 40 mg via ORAL
  Filled 2021-11-28 (×2): qty 1

## 2021-11-28 MED ORDER — SUGAMMADEX SODIUM 200 MG/2ML IV SOLN
INTRAVENOUS | Status: DC | PRN
  Administered 2021-11-28: 200 mg via INTRAVENOUS

## 2021-11-28 MED ORDER — ZOLPIDEM TARTRATE 5 MG PO TABS: 5.0000 mg | ORAL_TABLET | Freq: Every evening | ORAL | Status: DC | PRN

## 2021-11-28 MED ORDER — MELATONIN 10 MG PO TABS: 40.0000 mg | ORAL_TABLET | Freq: Every day | ORAL | Status: DC

## 2021-11-28 MED ORDER — PHENYLEPHRINE HCL (PRESSORS) 10 MG/ML IV SOLN
INTRAVENOUS | Status: DC | PRN
  Administered 2021-11-28 (×2): 160 ug via INTRAVENOUS

## 2021-11-28 MED ORDER — ONDANSETRON HCL 4 MG/2ML IJ SOLN
INTRAMUSCULAR | Status: AC
  Filled 2021-11-28: qty 2

## 2021-11-28 MED ORDER — ASPIRIN 81 MG PO TBEC: 81.0000 mg | DELAYED_RELEASE_TABLET | Freq: Two times a day (BID) | ORAL | 0 refills | Status: AC

## 2021-11-28 MED ORDER — ONDANSETRON HCL 4 MG PO TABS: 4.0000 mg | ORAL_TABLET | Freq: Four times a day (QID) | ORAL | Status: DC | PRN

## 2021-11-28 MED ORDER — SODIUM CHLORIDE (PF) 0.9 % IJ SOLN
INTRAMUSCULAR | Status: AC
  Filled 2021-11-28: qty 50

## 2021-11-28 MED ORDER — LIDOCAINE HCL (CARDIAC) PF 100 MG/5ML IV SOSY
PREFILLED_SYRINGE | INTRAVENOUS | Status: DC | PRN
  Administered 2021-11-28: 40 mg via INTRAVENOUS

## 2021-11-28 MED ORDER — CHLORHEXIDINE GLUCONATE 0.12 % MT SOLN
15.0000 mL | Freq: Once | OROMUCOSAL | Status: AC
  Administered 2021-11-28: 15 mL via OROMUCOSAL

## 2021-11-28 MED ORDER — ALBUTEROL SULFATE (2.5 MG/3ML) 0.083% IN NEBU: 2.5000 mg | INHALATION_SOLUTION | Freq: Four times a day (QID) | RESPIRATORY_TRACT | Status: DC | PRN

## 2021-11-28 MED ORDER — PHENYLEPHRINE HCL-NACL 20-0.9 MG/250ML-% IV SOLN
INTRAVENOUS | Status: DC | PRN
  Administered 2021-11-28: 50 ug/min via INTRAVENOUS

## 2021-11-28 MED ORDER — FENTANYL CITRATE (PF) 250 MCG/5ML IJ SOLN
INTRAMUSCULAR | Status: AC
  Filled 2021-11-28: qty 5

## 2021-11-28 MED ORDER — ACETAMINOPHEN 500 MG PO TABS
1000.0000 mg | ORAL_TABLET | Freq: Four times a day (QID) | ORAL | Status: DC
  Administered 2021-11-28 – 2021-11-29 (×3): 1000 mg via ORAL
  Filled 2021-11-28 (×4): qty 2

## 2021-11-28 MED ORDER — HYDROMORPHONE HCL 1 MG/ML IJ SOLN
INTRAMUSCULAR | Status: AC
  Filled 2021-11-28: qty 2

## 2021-11-28 MED ORDER — METHOCARBAMOL 500 MG PO TABS: 500.0000 mg | ORAL_TABLET | Freq: Three times a day (TID) | ORAL | 0 refills | Status: AC | PRN

## 2021-11-28 MED ORDER — FENTANYL CITRATE (PF) 100 MCG/2ML IJ SOLN
INTRAMUSCULAR | Status: DC | PRN
  Administered 2021-11-28 (×5): 50 ug via INTRAVENOUS

## 2021-11-28 MED ORDER — DEXAMETHASONE SODIUM PHOSPHATE 10 MG/ML IJ SOLN
INTRAMUSCULAR | Status: AC
  Filled 2021-11-28: qty 1

## 2021-11-28 MED ORDER — LACTATED RINGERS IV SOLN: INTRAVENOUS | Status: DC

## 2021-11-28 MED ORDER — ISOPROPYL ALCOHOL 70 % SOLN
Status: DC | PRN
  Administered 2021-11-28: 1 via TOPICAL

## 2021-11-28 MED ORDER — BUPIVACAINE LIPOSOME 1.3 % IJ SUSP
INTRAMUSCULAR | Status: DC | PRN
  Administered 2021-11-28: 20 mL

## 2021-11-28 MED ORDER — OXYCODONE HCL 5 MG PO TABS
5.0000 mg | ORAL_TABLET | ORAL | Status: DC | PRN
  Administered 2021-11-28 – 2021-11-29 (×3): 10 mg via ORAL
  Filled 2021-11-28 (×3): qty 2

## 2021-11-28 MED ORDER — PROPOFOL 10 MG/ML IV BOLUS
INTRAVENOUS | Status: AC
  Filled 2021-11-28: qty 20

## 2021-11-28 MED ORDER — POLYETHYLENE GLYCOL 3350 17 G PO PACK: 17.0000 g | PACK | Freq: Every day | ORAL | Status: DC | PRN

## 2021-11-28 MED ORDER — METHOCARBAMOL 500 MG PO TABS
500.0000 mg | ORAL_TABLET | Freq: Four times a day (QID) | ORAL | Status: DC | PRN
  Administered 2021-11-28 – 2021-11-29 (×2): 500 mg via ORAL
  Filled 2021-11-28 (×2): qty 1

## 2021-11-28 MED ORDER — ROCURONIUM BROMIDE 10 MG/ML (PF) SYRINGE
PREFILLED_SYRINGE | INTRAVENOUS | Status: AC
  Filled 2021-11-28: qty 10

## 2021-11-28 MED ORDER — SODIUM CHLORIDE 0.9 % IV SOLN: INTRAVENOUS | Status: DC

## 2021-11-28 MED ORDER — PHENOL 1.4 % MT LIQD: 1.0000 | OROMUCOSAL | Status: DC | PRN

## 2021-11-28 MED ORDER — TRANEXAMIC ACID-NACL 1000-0.7 MG/100ML-% IV SOLN
1000.0000 mg | INTRAVENOUS | Status: AC
  Administered 2021-11-28: 1000 mg via INTRAVENOUS
  Filled 2021-11-28: qty 100

## 2021-11-28 MED ORDER — TORSEMIDE 20 MG PO TABS: 10.0000 mg | ORAL_TABLET | Freq: Every day | ORAL | Status: DC | PRN

## 2021-11-28 MED ORDER — KETOROLAC TROMETHAMINE 15 MG/ML IJ SOLN
7.5000 mg | Freq: Four times a day (QID) | INTRAMUSCULAR | Status: AC
  Administered 2021-11-28 – 2021-11-29 (×4): 7.5 mg via INTRAVENOUS
  Filled 2021-11-28 (×4): qty 1

## 2021-11-28 MED ORDER — POVIDONE-IODINE 10 % EX SWAB
2.0000 | Freq: Once | CUTANEOUS | Status: AC
  Administered 2021-11-28: 2 via TOPICAL

## 2021-11-28 MED ORDER — MELOXICAM 15 MG PO TABS: 15.0000 mg | ORAL_TABLET | Freq: Every day | ORAL | 0 refills | Status: AC

## 2021-11-28 MED ORDER — CLONAZEPAM 1 MG PO TABS
2.0000 mg | ORAL_TABLET | Freq: Every day | ORAL | Status: DC
  Administered 2021-11-28: 2 mg via ORAL
  Filled 2021-11-28: qty 2

## 2021-11-28 MED ORDER — ONDANSETRON HCL 4 MG/2ML IJ SOLN
INTRAMUSCULAR | Status: DC | PRN
  Administered 2021-11-28: 4 mg via INTRAVENOUS

## 2021-11-28 MED ORDER — PROPOFOL 10 MG/ML IV BOLUS
INTRAVENOUS | Status: DC | PRN
  Administered 2021-11-28: 130 mg via INTRAVENOUS

## 2021-11-28 MED ORDER — ACETAMINOPHEN 500 MG PO TABS
1000.0000 mg | ORAL_TABLET | Freq: Once | ORAL | Status: AC
  Administered 2021-11-28: 500 mg via ORAL
  Filled 2021-11-28: qty 2

## 2021-11-28 MED ORDER — METHOCARBAMOL 500 MG IVPB - SIMPLE MED: 500.0000 mg | Freq: Four times a day (QID) | INTRAVENOUS | Status: DC | PRN

## 2021-11-28 MED ORDER — ASPIRIN 81 MG PO CHEW
81.0000 mg | CHEWABLE_TABLET | Freq: Two times a day (BID) | ORAL | Status: DC
  Administered 2021-11-28 – 2021-11-29 (×2): 81 mg via ORAL
  Filled 2021-11-28 (×2): qty 1

## 2021-11-28 MED ORDER — ONDANSETRON HCL 4 MG PO TABS: 4.0000 mg | ORAL_TABLET | Freq: Three times a day (TID) | ORAL | 0 refills | Status: AC | PRN

## 2021-11-28 MED ORDER — LIDOCAINE HCL (PF) 2 % IJ SOLN
INTRAMUSCULAR | Status: AC
  Filled 2021-11-28: qty 5

## 2021-11-28 MED ORDER — MELATONIN 5 MG PO TABS
20.0000 mg | ORAL_TABLET | Freq: Every day | ORAL | Status: DC
  Administered 2021-11-28: 20 mg via ORAL
  Filled 2021-11-28: qty 4

## 2021-11-28 MED ORDER — CEFAZOLIN SODIUM-DEXTROSE 2-4 GM/100ML-% IV SOLN
2.0000 g | INTRAVENOUS | Status: AC
  Administered 2021-11-28: 2 g via INTRAVENOUS
  Filled 2021-11-28: qty 100

## 2021-11-28 MED ORDER — ROCURONIUM BROMIDE 100 MG/10ML IV SOLN
INTRAVENOUS | Status: DC | PRN
  Administered 2021-11-28: 60 mg via INTRAVENOUS

## 2021-11-28 MED ORDER — CEFAZOLIN SODIUM-DEXTROSE 2-4 GM/100ML-% IV SOLN
2.0000 g | Freq: Four times a day (QID) | INTRAVENOUS | Status: AC
  Administered 2021-11-28 (×2): 2 g via INTRAVENOUS
  Filled 2021-11-28 (×2): qty 100

## 2021-11-28 MED ORDER — OXYCODONE HCL 5 MG PO TABS: 5.0000 mg | ORAL_TABLET | ORAL | 0 refills | Status: AC | PRN

## 2021-11-28 MED ORDER — MENTHOL 3 MG MT LOZG: 1.0000 | LOZENGE | OROMUCOSAL | Status: DC | PRN

## 2021-11-28 MED ORDER — SODIUM CHLORIDE (PF) 0.9 % IJ SOLN
INTRAMUSCULAR | Status: DC | PRN
  Administered 2021-11-28: 60 mL

## 2021-11-28 MED ORDER — SODIUM CHLORIDE (PF) 0.9 % IJ SOLN
INTRAMUSCULAR | Status: AC
  Filled 2021-11-28: qty 10

## 2021-11-28 MED ORDER — PROPOFOL 1000 MG/100ML IV EMUL
INTRAVENOUS | Status: AC
  Filled 2021-11-28: qty 100

## 2021-11-28 MED ORDER — SURGIRINSE WOUND IRRIGATION SYSTEM - OPTIME
TOPICAL | Status: DC | PRN
  Administered 2021-11-28: 450 mL via TOPICAL

## 2021-11-28 MED ORDER — SENNA 8.6 MG PO TABS
1.0000 | ORAL_TABLET | Freq: Two times a day (BID) | ORAL | Status: DC
  Administered 2021-11-28 – 2021-11-29 (×2): 8.6 mg via ORAL
  Filled 2021-11-28 (×2): qty 1

## 2021-11-28 MED ORDER — OYSTER SHELL CALCIUM/D3 500-5 MG-MCG PO TABS
1.0000 | ORAL_TABLET | Freq: Every day | ORAL | Status: DC
  Administered 2021-11-29: 1 via ORAL
  Filled 2021-11-28: qty 1

## 2021-11-28 MED ORDER — PHENYLEPHRINE HCL-NACL 20-0.9 MG/250ML-% IV SOLN
INTRAVENOUS | Status: AC
  Filled 2021-11-28: qty 250

## 2021-11-28 MED ORDER — ONDANSETRON HCL 4 MG/2ML IJ SOLN
4.0000 mg | Freq: Four times a day (QID) | INTRAMUSCULAR | Status: DC | PRN
  Administered 2021-11-28: 4 mg via INTRAVENOUS
  Filled 2021-11-28: qty 2

## 2021-11-28 MED ORDER — 0.9 % SODIUM CHLORIDE (POUR BTL) OPTIME
TOPICAL | Status: DC | PRN
  Administered 2021-11-28: 1000 mL

## 2021-11-28 MED ORDER — HYDROMORPHONE HCL 1 MG/ML IJ SOLN: 0.5000 mg | INTRAMUSCULAR | Status: DC | PRN

## 2021-11-28 MED ORDER — HYDROMORPHONE HCL 1 MG/ML IJ SOLN
0.2500 mg | INTRAMUSCULAR | Status: DC | PRN
  Administered 2021-11-28 (×3): 0.5 mg via INTRAVENOUS

## 2021-11-28 MED ORDER — BUPIVACAINE LIPOSOME 1.3 % IJ SUSP
INTRAMUSCULAR | Status: AC
  Filled 2021-11-28: qty 20

## 2021-11-28 MED ORDER — BUPIVACAINE LIPOSOME 1.3 % IJ SUSP: 10.0000 mL | Freq: Once | INTRAMUSCULAR | Status: DC

## 2021-11-28 MED ORDER — DIPHENHYDRAMINE HCL 12.5 MG/5ML PO ELIX: 12.5000 mg | ORAL_SOLUTION | ORAL | Status: DC | PRN

## 2021-11-28 SURGICAL SUPPLY — 65 items
ADH SKN CLS APL DERMABOND .7 (GAUZE/BANDAGES/DRESSINGS) ×1
APL PRP STRL LF DISP 70% ISPRP (MISCELLANEOUS) ×2
BAG COUNTER SPONGE SURGICOUNT (BAG) IMPLANT
BAG DECANTER FOR FLEXI CONT (MISCELLANEOUS) ×2 IMPLANT
BAG SPEC THK2 15X12 ZIP CLS (MISCELLANEOUS) ×1
BAG SPNG CNTER NS LX DISP (BAG) ×2
BAG ZIPLOCK 12X15 (MISCELLANEOUS) ×2 IMPLANT
BLADE SAW SAG 25X90X1.19 (BLADE) ×2 IMPLANT
CHLORAPREP W/TINT 26 (MISCELLANEOUS) ×4 IMPLANT
COVER SURGICAL LIGHT HANDLE (MISCELLANEOUS) ×2 IMPLANT
DERMABOND ADVANCED .7 DNX12 (GAUZE/BANDAGES/DRESSINGS) ×2 IMPLANT
DRAPE HIP W/POCKET STRL (MISCELLANEOUS) ×2 IMPLANT
DRAPE INCISE IOBAN 66X45 STRL (DRAPES) ×2 IMPLANT
DRAPE INCISE IOBAN 85X60 (DRAPES) ×2 IMPLANT
DRAPE POUCH INSTRU U-SHP 10X18 (DRAPES) ×2 IMPLANT
DRAPE SHEET LG 3/4 BI-LAMINATE (DRAPES) ×6 IMPLANT
DRAPE SURG 17X11 SM STRL (DRAPES) ×2 IMPLANT
DRAPE U-SHAPE 47X51 STRL (DRAPES) ×4 IMPLANT
DRESSING AQUACEL AG SP 3.5X10 (GAUZE/BANDAGES/DRESSINGS) ×2 IMPLANT
DRSG AQUACEL AG ADV 3.5X10 (GAUZE/BANDAGES/DRESSINGS) IMPLANT
DRSG AQUACEL AG SP 3.5X10 (GAUZE/BANDAGES/DRESSINGS) ×1
ELECT BLADE TIP CTD 4 INCH (ELECTRODE) ×2 IMPLANT
ELECT REM PT RETURN 15FT ADLT (MISCELLANEOUS) ×2 IMPLANT
GLOVE BIOGEL PI IND STRL 8 (GLOVE) ×2 IMPLANT
GLOVE SURG ORTHO 8.0 STRL STRW (GLOVE) ×4 IMPLANT
GOWN STRL REUS W/ TWL XL LVL3 (GOWN DISPOSABLE) ×2 IMPLANT
GOWN STRL REUS W/TWL XL LVL3 (GOWN DISPOSABLE) ×1
HANDPIECE INTERPULSE COAX TIP (DISPOSABLE)
HEAD CERAMIC FEMORAL 36MM (Head) IMPLANT
HOLDER FOLEY CATH W/STRAP (MISCELLANEOUS) ×2 IMPLANT
HOOD PEEL AWAY FLYTE STAYCOOL (MISCELLANEOUS) ×6 IMPLANT
INSERT 0 DEGREE 36 (Miscellaneous) IMPLANT
KIT BASIN OR (CUSTOM PROCEDURE TRAY) ×2 IMPLANT
KIT TURNOVER KIT A (KITS) IMPLANT
MANIFOLD NEPTUNE II (INSTRUMENTS) ×2 IMPLANT
MARKER SKIN DUAL TIP RULER LAB (MISCELLANEOUS) ×2 IMPLANT
NEEDLE HYPO 22GX1.5 SAFETY (NEEDLE) IMPLANT
NS IRRIG 1000ML POUR BTL (IV SOLUTION) ×2 IMPLANT
PACK TOTAL JOINT (CUSTOM PROCEDURE TRAY) ×2 IMPLANT
PRESSURIZER FEMORAL UNIV (MISCELLANEOUS) IMPLANT
PROTECTOR NERVE ULNAR (MISCELLANEOUS) ×2 IMPLANT
RETRIEVER SUT HEWSON (MISCELLANEOUS) ×2 IMPLANT
SCREW HEX LP 6.5X20 (Screw) IMPLANT
SCREW HEX LP 6.5X25 (Screw) IMPLANT
SEALER BIPOLAR AQUA 6.0 (INSTRUMENTS) ×2 IMPLANT
SET HNDPC FAN SPRY TIP SCT (DISPOSABLE) IMPLANT
SHELL TRIDENT II CLUST 50 (Shell) IMPLANT
SPIKE FLUID TRANSFER (MISCELLANEOUS) ×6 IMPLANT
STEM HIP 127 DEG (Stem) IMPLANT
SUCTION FRAZIER HANDLE 12FR (TUBING) ×1
SUCTION TUBE FRAZIER 12FR DISP (TUBING) ×2 IMPLANT
SUT BONE WAX W31G (SUTURE) ×2 IMPLANT
SUT ETHIBOND #5 BRAIDED 30INL (SUTURE) ×2 IMPLANT
SUT MNCRL AB 3-0 PS2 18 (SUTURE) ×2 IMPLANT
SUT STRATAFIX 0 PDS 27 VIOLET (SUTURE) ×1
SUT STRATAFIX PDO 1 14 VIOLET (SUTURE) ×1
SUT STRATFX PDO 1 14 VIOLET (SUTURE) ×1
SUT VIC AB 2-0 CT2 27 (SUTURE) ×4 IMPLANT
SUTURE STRATFX 0 PDS 27 VIOLET (SUTURE) ×2 IMPLANT
SUTURE STRATFX PDO 1 14 VIOLET (SUTURE) ×2 IMPLANT
SYR 20ML LL LF (SYRINGE) ×4 IMPLANT
TOWEL OR 17X26 10 PK STRL BLUE (TOWEL DISPOSABLE) ×2 IMPLANT
TRAY FOLEY MTR SLVR 16FR STAT (SET/KITS/TRAYS/PACK) ×2 IMPLANT
UNDERPAD 30X36 HEAVY ABSORB (UNDERPADS AND DIAPERS) ×2 IMPLANT
WATER STERILE IRR 1000ML POUR (IV SOLUTION) ×4 IMPLANT

## 2021-11-28 NOTE — Interval H&P Note (Signed)
The patient has been re-examined, and the chart reviewed, and there have been no interval changes to the documented history and physical.    Plan for R THA for hip OA. She has done well refraining from smoking and nicotine use.  The operative side was examined and the patient was confirmed to have sensation to DPN, SPN, TN intact, Motor EHL, ext, flex 5/5, and DP 2+, PT 2+, No significant edema.   The risks, benefits, and alternatives have been discussed at length with patient, and the patient is willing to proceed.  Right hip marked. Consent has been signed.

## 2021-11-28 NOTE — Plan of Care (Signed)
  Problem: Activity: Goal: Ability to avoid complications of mobility impairment will improve Outcome: Progressing   Problem: Pain Management: Goal: Pain level will decrease with appropriate interventions Outcome: Progressing   Problem: Nutrition: Goal: Adequate nutrition will be maintained Outcome: Progressing   Problem: Elimination: Goal: Will not experience complications related to urinary retention Outcome: Progressing

## 2021-11-28 NOTE — Op Note (Addendum)
11/28/2021  1:00 PM  PATIENT:  Caitlin Galloway   MRN: [id number]  PRE-OPERATIVE DIAGNOSIS: End-stage left hip osteoarthritis  POST-OPERATIVE DIAGNOSIS:  same  PROCEDURE:  Procedure(s): TOTAL HIP ARTHROPLASTY  PREOPERATIVE INDICATIONS:    Caitlin Galloway is an 76 y.o. female who has a diagnosis of end-stage left hip osteoarthritis and elected for surgical management after failing conservative treatment.  The risks benefits and alternatives were discussed with the patient including but not limited to the risks of nonoperative treatment, versus surgical intervention including infection, bleeding, nerve injury, periprosthetic fracture, the need for revision surgery, dislocation, leg length discrepancy, blood clots, cardiopulmonary complications, morbidity, mortality, among others, and they were willing to proceed.     OPERATIVE REPORT     SURGEON:  Charlies Constable, MD    ASSISTANT: Izola Price, RNFA, (Present throughout the entire procedure,  necessary for completion of procedure in a timely manner, assisting with retraction, instrumentation, and closure)     ANESTHESIA: General  ESTIMATED BLOOD LOSS: 086 cc    COMPLICATIONS:  None.   COMPONENTS:   Stryker Trident 250 mm acetabular shell, neutral polyliner, 127 degree neck angle Accolade 2 size #5, 36+0 ceramic femoral head Implant Name Type Inv. Item Serial No. Manufacturer Lot No. LRB No. Used Action  SHELL TRIDENT II CLUST 50 - [id number] Shell SHELL TRIDENT II CLUST 50  [employer] [id number] A Right 1 Implanted  SCREW HEX LP 6.5X25 - [id number] Screw SCREW HEX LP 6.5X25  [employer] [id number] Right 1 Implanted  SCREW HEX LP 6.5X20 - [id number] Screw SCREW HEX LP 6.5X20  [employer] [id number] Right 1 Implanted  INSERT 0 DEGREE 36 - [id number] Miscellaneous INSERT 0 DEGREE 36  [employer] [id number] Right 1 Implanted  STEM HIP 127 DEG - [id number] Stem STEM HIP 127 DEG  [employer] [id number] A Right 1 Implanted   HEAD CERAMIC FEMORAL 36MM - [id number] Head HEAD CERAMIC FEMORAL 36MM  [employer] [id number] Right 1 Implanted      PROCEDURE IN DETAIL:   The patient was met in the holding area and  identified.  The appropriate hip was identified and marked at the operative site.  The patient was then transported to the OR  and  placed under anesthesia.  At that point, the patient was  placed in the lateral decubitus position with the operative side up and  secured to the operating room table  and all bony prominences padded. A subaxillary role was also placed.    The operative lower extremity was prepped from the iliac crest to the distal leg.  Sterile draping was performed.  Preoperative antibiotics, 2 gm of ancef,1 gm of Tranexamic Acid, and 8 mg of Decadron administered. Time out was performed prior to incision.      A routine posterolateral approach was utilized via sharp dissection  carried down to the subcutaneous tissue.  Gross bleeders were Bovie coagulated.  The iliotibial band was identified and incised along the length of the skin incision through the glute max fascia.  Charnley retractor was placed with care to protect the sciatic nerve posteriorly.  With the hip internally rotated, the piriformis tendon was identified and released from the femoral insertion and tagged with a #5 Ethibond.  A capsulotomy was then performed off the femoral insertion and also tagged with a #5 Ethibond.    The femoral neck was exposed, and I resected the femoral neck based on preoperative templating relative to the lesser trochanter.    I then exposed the deep  acetabulum, cleared out any tissue including the ligamentum teres.  After adequate visualization, I excised the labrum.  I then started reaming with a 46 mm reamer, first medializing to the floor of the cotyloid fossa, and then in the position of the cup aiming towards the greater sciatic notch, matching the version of the transverse acetabular ligament and  tucked under the anterior wall. I reamed up to 50 mm reamer with good bony bed preparation and a 50 mm cup was chosen.  The real cup was then impacted into place.  Appropriate version and inclination was confirmed clinically matching their bony anatomy, and also with the use of the jig.  I placed 2 screws in the posterior superior quadrant to augment fixation.  A neutral liner was placed and impacted. It was confirmed to be appropriately seated and the acetabular retractors were removed.    I then prepared the proximal femur using the box cutter, Charnley awl, and then sequentially broached starting with 0 up to a size 5.  A trial broach, neck, and head was utilized, and I reduced the hip and it was found to have excellent stability.  There was no impingement with full extension and 90 degrees external rotation.  The hip was stable at the position of sleep and with 90 degrees flexion and 80degrees of internal rotation.  Leg lengths were also clinically assessed in the lateral position and felt to be equal. Intra-Op flatplate was obtained and confirmed appropriate component positions.  Good fill of the femur with the size 5 broach.  And restoration of leg length and offset. No evidence or concern for fracture.  A final femoral prosthesis size 5 was selected. I then impacted the real femoral prosthesis into place.I again trialed and selected a 36+ 55m ball. The hip was then reduced and taken through a range of motion. There was no impingement with full extension and 90 degrees external rotation.  The hip was stable at the position of sleep and with 90 degrees flexion and 80 degrees of internal rotation. Leg lengths were  again assessed and felt to be restored.  We then opened, and I impacted the real head ball into place.  The posterior capsule was then closed with #5 Ethibond.  The piriformis was repaired through the base of the abductor tendon using a [place] suture passer.  I then irrigated the hip  copiously with dilute Betadine and with normal saline pulse lavage. Periarticular injection was then performed with Exparel.   We repaired the fascia #1 barbed suture, followed by 0 barbed suture for the subcutaneous fat.  Skin was closed with 2-0 Vicryl and 3-0 Monocryl.  Dermabond and Aquacel dressing were applied. The patient was then awakened and returned to PACU in stable and satisfactory condition.  Leg lengths in the supine position were assessed and felt to be clinically equal. There were no complications.  Post op recs: WB: WBAT RLE, No formal hip precautions Abx: ancef Imaging: PACU pelvis Xray Dressing: Aquacell, keep intact until follow up DVT prophylaxis: Aspirin 81BID starting POD1, resume plavix POD2 Follow up: 2 weeks after surgery for a wound check with Dr. [facility]achery [facility]at [facility]  Address: [address] [address] Office Phone: [phone]  DCharlies Constable MD Orthopedic Surgeon

## 2021-11-28 NOTE — Plan of Care (Signed)

## 2021-11-28 NOTE — Anesthesia Preprocedure Evaluation (Addendum)
Anesthesia Evaluation  Patient identified by MRN, date of birth, ID band Patient awake    Reviewed: Allergy & Precautions, NPO status , Patient's Chart, lab work & pertinent test results  Airway Mallampati: II       Dental   Pulmonary shortness of breath, COPD, Current Smoker and Patient abstained from smoking.,    breath sounds clear to auscultation       Cardiovascular hypertension,  Rhythm:Regular Rate:Normal     Neuro/Psych PSYCHIATRIC DISORDERS TIA   GI/Hepatic Neg liver ROS, GERD  ,  Endo/Other    Renal/GU Renal disease     Musculoskeletal  (+) Arthritis ,   Abdominal   Peds  Hematology   Anesthesia Other Findings   Reproductive/Obstetrics                             Anesthesia Physical Anesthesia Plan  ASA: 3  Anesthesia Plan: General   Post-op Pain Management:    Induction: Intravenous  PONV Risk Score and Plan: 2 and Ondansetron, Dexamethasone and Midazolam  Airway Management Planned: Oral ETT  Additional Equipment:   Intra-op Plan:   Post-operative Plan: Extubation in OR  Informed Consent: I have reviewed the patients History and Physical, chart, labs and discussed the procedure including the risks, benefits and alternatives for the proposed anesthesia with the patient or authorized representative who has indicated his/her understanding and acceptance.     Dental advisory given  Plan Discussed with: Anesthesiologist and CRNA  Anesthesia Plan Comments:       Anesthesia Quick Evaluation

## 2021-11-28 NOTE — Transfer of Care (Signed)
Immediate Anesthesia Transfer of Care Note  Patient: Caitlin Galloway  Procedure(s) Performed: TOTAL HIP ARTHROPLASTY (Right: Hip)  Patient Location: PACU  Anesthesia Type:General  Level of Consciousness: sedated  Airway & Oxygen Therapy: Patient Spontanous Breathing  Post-op Assessment: Report given to RN  Post vital signs: stable  Last Vitals:  Vitals Value Taken Time  BP 131/67 11/28/21 1254  Temp 36.3 C 11/28/21 1254  Pulse 69 11/28/21 1259  Resp 17 11/28/21 1259  SpO2 97 % 11/28/21 1259  Vitals shown include unvalidated device data.  Last Pain:  Vitals:   11/28/21 0825  TempSrc:   PainSc: 6       Patients Stated Pain Goal: 5 (23/00/97 9499)  Complications: No notable events documented.

## 2021-11-28 NOTE — Discharge Instructions (Signed)

## 2021-11-28 NOTE — Anesthesia Postprocedure Evaluation (Signed)
Anesthesia Post Note  Patient: Caitlin Galloway  Procedure(s) Performed: TOTAL HIP ARTHROPLASTY (Right: Hip)     Patient location during evaluation: PACU Anesthesia Type: General Level of consciousness: awake Pain management: pain level controlled Vital Signs Assessment: post-procedure vital signs reviewed and stable Respiratory status: spontaneous breathing Postop Assessment: no apparent nausea or vomiting Anesthetic complications: no   No notable events documented.  Last Vitals:  Vitals:   11/28/21 1415 11/28/21 1435  BP: (!) 141/63 129/62  Pulse: 73 75  Resp: 13 15  Temp: (!) 36.3 C 36.4 C  SpO2: 97% 94%    Last Pain:  Vitals:   11/28/21 1435  TempSrc: Axillary  PainSc: 0-No pain                 Khushboo Chuck

## 2021-11-28 NOTE — Anesthesia Procedure Notes (Signed)
Procedure Name: Intubation Date/Time: 11/28/2021 10:55 AM  Performed by: Isaly Fasching, Forest Gleason, CRNAPre-anesthesia Checklist: Patient identified, Emergency Drugs available, Suction available, Patient being monitored and Timeout performed Patient Re-evaluated:Patient Re-evaluated prior to induction Oxygen Delivery Method: Circle system utilized Preoxygenation: Pre-oxygenation with 100% oxygen Induction Type: IV induction Ventilation: Mask ventilation without difficulty Laryngoscope Size: Mac and 4 Grade View: Grade I Tube type: Oral Tube size: 7.0 mm Number of attempts: 1 Airway Equipment and Method: Stylet Placement Confirmation: ETT inserted through vocal cords under direct vision, positive ETCO2, CO2 detector and breath sounds checked- equal and bilateral Secured at: 22 cm Tube secured with: Tape Dental Injury: Teeth and Oropharynx as per pre-operative assessment

## 2021-11-28 NOTE — Evaluation (Signed)
Physical Therapy Evaluation Patient Details Name: Caitlin Galloway MRN: [id number] DOB: May 31, 1945 Today's Date: 11/28/2021  History of Present Illness  Pt is a 76yo female presenting s/p R-THA, posterior approach on 11/28/21. No formal hip precautions. PMH: bipolar, CKd, COPD, GERD, HTN, macular degeneration of both eyes, hx of TIA 2014, tobacco abuse.  Clinical Impression  Caitlin Galloway is a 76 y.o. female POD 0 s/p R-THA, posterior approach without formal precautions. Patient reports modified independence using RW with mobility at baseline. Patient is now limited by functional impairments (see PT problem list below) and requires min assist for bed mobility and for transfers, pt dizzy and somewhat lethargic so further mobility deferred, BP 136/58, HR86, SpO293% on RA; pt was on 4L O2 via [place] at entry and exit but RA for duration of session. Patient instructed in exercise to facilitate ROM and circulation to manage edema. Provided incentive spirometer and with Vcs pt able to achieve 128m. Patient will benefit from continued skilled PT interventions to address impairments and progress towards PLOF. Acute PT will follow to progress mobility and stair training in preparation for safe discharge home.       Recommendations for follow up therapy are one component of a multi-disciplinary discharge planning process, led by the attending physician.  Recommendations may be updated based on patient status, additional functional criteria and insurance authorization.  Follow Up Recommendations Follow physician's recommendations for discharge plan and follow up therapies      Assistance Recommended at Discharge Intermittent Supervision/Assistance  Patient can return home with the following  A little help with walking and/or transfers;A little help with bathing/dressing/bathroom;Assistance with cooking/housework;Assist for transportation;Help with stairs or ramp for entrance    Equipment Recommendations None  recommended by PT  Recommendations for Other Services       Functional Status Assessment Patient has had a recent decline in their functional status and demonstrates the ability to make significant improvements in function in a reasonable and predictable amount of time.     Precautions / Restrictions Precautions Precautions: Fall Restrictions Weight Bearing Restrictions: Yes RLE Weight Bearing: Weight bearing as tolerated      Mobility  Bed Mobility Overal bed mobility: Needs Assistance Bed Mobility: Supine to Sit     Supine to sit: Min assist     General bed mobility comments: Min assist to bring RLE off bed and to scoot EOB via chuck pad    Transfers Overall transfer level: Needs assistance Equipment used: Rolling walker (2 wheels) Transfers: Sit to/from Stand, Bed to chair/wheelchair/BSC Sit to Stand: Min assist, From elevated surface   Step pivot transfers: Min assist, +2 safety/equipment       General transfer comment: Pt required min assist for light lift assist from elevated surface, pt reporting dizziness so returned to sitting positioin EOB, VSS. Once pt reporting cessation of symptoms, pt completed sit to stand additional rep, required min assist again. Pt completed step pivot transfer with min assist, VCs for sequencing.    Ambulation/Gait               General Gait Details: deferred  Stairs            Wheelchair Mobility    Modified Rankin (Stroke Patients Only)       Balance Overall balance assessment: Needs assistance Sitting-balance support: Feet supported, No upper extremity supported Sitting balance-Leahy Scale: Poor     Standing balance support: Reliant on assistive device for balance, During functional activity, Bilateral upper extremity supported Standing  balance-Leahy Scale: Poor                               Pertinent Vitals/Pain      Home Living Family/patient expects to be discharged to:: Private  residence Living Arrangements: Alone Available Help at Discharge: Available 24 hours/day;Friend(s) ([place], retired Therapist, sports, will be staying with her) Type of Home: House Home Access: Stairs to enter Entrance Stairs-Rails: None Entrance Stairs-Number of Steps: 3   Home Layout: One level Home Equipment: Conservation officer, nature (2 wheels);Toilet riser;Cane - single point Additional Comments: Pt is retired Estate agent Prior Level of Function : Independent/Modified Independent;Driving             Mobility Comments: RW during painful episodes ADLs Comments: IND     Hand Dominance   Dominant Hand: Right    Extremity/Trunk Assessment   Upper Extremity Assessment Upper Extremity Assessment: Overall WFL for tasks assessed    Lower Extremity Assessment Lower Extremity Assessment: RLE deficits/detail;LLE deficits/detail RLE Deficits / Details: MMT ank DF/PF 5/5, glute firing not detectable by palpation RLE Sensation: WNL;decreased light touch (Decreased sensation at glutes) LLE Deficits / Details: MMT ank DF/PF 5/5, glute firing not detectable by palpation LLE Sensation: WNL;decreased light touch (decreased light tough at glutes, WNL at foot)    Cervical / Trunk Assessment Cervical / Trunk Assessment: Kyphotic  Communication   Communication: No difficulties  Cognition Arousal/Alertness: Lethargic Behavior During Therapy: WFL for tasks assessed/performed Overall Cognitive Status: Within Functional Limits for tasks assessed                                          General Comments General comments (skin integrity, edema, etc.): sister Magda Paganini present    Exercises Total Joint Exercises Ankle Circles/Pumps: AROM, Both, 20 reps   Assessment/Plan    PT Assessment Patient needs continued PT services  PT Problem List Decreased strength;Decreased range of motion;Decreased activity tolerance;Decreased balance;Decreased mobility;Decreased  coordination;Pain       PT Treatment Interventions DME instruction;Gait training;Stair training;Functional mobility training;Therapeutic activities;Therapeutic exercise;Balance training;Neuromuscular re-education;Patient/family education    PT Goals (Current goals can be found in the Care Plan section)  Acute Rehab PT Goals Patient Stated Goal: Yardwork PT Goal Formulation: With patient/family Time For Goal Achievement: 12/05/21 Potential to Achieve Goals: Good    Frequency 7X/week     Co-evaluation               AM-PAC PT "6 Clicks" Mobility  Outcome Measure Help needed turning from your back to your side while in a flat bed without using bedrails?: A Little Help needed moving from lying on your back to sitting on the side of a flat bed without using bedrails?: A Little Help needed moving to and from a bed to a chair (including a wheelchair)?: A Little Help needed standing up from a chair using your arms (e.g., wheelchair or bedside chair)?: A Little Help needed to walk in hospital room?: A Little Help needed climbing 3-5 steps with a railing? : A Little 6 Click Score: 18    End of Session Equipment Utilized During Treatment: Gait belt;Other (comment) (SpO2 monitored on RA, remained above 90%, replaced [place] at end of session pt on 4L) Activity Tolerance: Patient tolerated treatment well;No increased pain Patient left: in chair;with call bell/phone within  reach;with chair alarm set;with family/visitor present;with SCD's reapplied Nurse Communication: Mobility status PT Visit Diagnosis: Pain;Difficulty in walking, not elsewhere classified (R26.2) Pain - Right/Left: Right Pain - part of body: Hip    Time: 1594-5859 PT Time Calculation (min) (ACUTE ONLY): 20 min   Charges:   PT Evaluation $PT Eval Low Complexity: 1 Low          Coolidge Breeze, PT, DPT [facility] Office: [phone] Weekend pager: [phone]  Akeira Lahm 11/28/2021,  6:17 PM

## 2021-11-29 DIAGNOSIS — N189 Chronic kidney disease, unspecified: Secondary | ICD-10-CM | POA: Diagnosis not present

## 2021-11-29 DIAGNOSIS — Z79899 Other long term (current) drug therapy: Secondary | ICD-10-CM | POA: Diagnosis not present

## 2021-11-29 DIAGNOSIS — M1612 Unilateral primary osteoarthritis, left hip: Secondary | ICD-10-CM | POA: Diagnosis not present

## 2021-11-29 DIAGNOSIS — F17210 Nicotine dependence, cigarettes, uncomplicated: Secondary | ICD-10-CM | POA: Diagnosis not present

## 2021-11-29 DIAGNOSIS — J449 Chronic obstructive pulmonary disease, unspecified: Secondary | ICD-10-CM | POA: Diagnosis not present

## 2021-11-29 DIAGNOSIS — I129 Hypertensive chronic kidney disease with stage 1 through stage 4 chronic kidney disease, or unspecified chronic kidney disease: Secondary | ICD-10-CM | POA: Diagnosis not present

## 2021-11-29 DIAGNOSIS — Z8673 Personal history of transient ischemic attack (TIA), and cerebral infarction without residual deficits: Secondary | ICD-10-CM | POA: Diagnosis not present

## 2021-11-29 DIAGNOSIS — Z7902 Long term (current) use of antithrombotics/antiplatelets: Secondary | ICD-10-CM | POA: Diagnosis not present

## 2021-11-29 LAB — BASIC METABOLIC PANEL
Anion gap: 7 (ref 5–15)
BUN: 10 mg/dL (ref 8–23)
CO2: 25 mmol/L (ref 22–32)
Calcium: 8.8 mg/dL — ABNORMAL LOW (ref 8.9–10.3)
Chloride: 105 mmol/L (ref 98–111)
Creatinine, Ser: 0.68 mg/dL (ref 0.44–1.00)
GFR, Estimated: >60 mL/min (ref >60)
Glucose, Bld: 134 mg/dL — ABNORMAL HIGH (ref 70–99)
Potassium: 4.3 mmol/L (ref 3.5–5.1)
Sodium: 137 mmol/L (ref 135–145)

## 2021-11-29 LAB — CBC
HCT: 38.8 % (ref 36.0–46.0)
Hemoglobin: 13.1 g/dL (ref 12.0–15.0)
MCH: 32.4 pg (ref 26.0–34.0)
MCHC: 33.8 g/dL (ref 30.0–36.0)
MCV: 96 fL (ref 80.0–100.0)
Platelets: 258 10*3/uL (ref 150–400)
RBC: 4.04 MIL/uL (ref 3.87–5.11)
RDW: 13 % (ref 11.5–15.5)
WBC: 10.3 10*3/uL (ref 4.0–10.5)
nRBC: 0 % (ref 0.0–0.2)

## 2021-11-29 NOTE — TOC Transition Note (Signed)
Transition of Care [facility]) - CM/SW Discharge Note   Patient Details  Name: Caitlin Galloway MRN: [id number] Date of Birth: 11-23-1945  Transition [facility]) CM/SW Contact:  Lennart Pall, LCSW Phone Number: 11/29/2021, 2:00 PM   Clinical Narrative:     Met with pt and confirming she has all needed DME at home.  OPPT already set up with [facility].  No TOC needs.  Final next level of care: OP Rehab Barriers to Discharge: No Barriers Identified   Patient Goals and CMS Choice Patient states their goals for this hospitalization and ongoing recovery are:: return home      Discharge Placement                       Discharge Plan and Services                DME Arranged: N/A DME Agency: NA                  Social Determinants of Health (SDOH) Interventions     Readmission Risk Interventions     No data to display

## 2021-11-29 NOTE — Progress Notes (Signed)
Pt is ready for discharge.  Remains alert and oriented.  Multiple visitors at bedside.  TED hose applied.  AVS given.  Pt able to verbalize understanding of all discharge instructions, including wound care.  All questions answered.

## 2021-11-29 NOTE — Plan of Care (Signed)
Problem: Education: Goal: Knowledge of the prescribed therapeutic regimen will improve 11/29/2021 1307 by Malaki Koury L, RN Outcome: Adequate for Discharge 11/29/2021 0906 by Vernida Mcnicholas L, RN Outcome: Progressing Goal: Understanding of discharge needs will improve 11/29/2021 1307 by Loucile Posner L, RN Outcome: Adequate for Discharge 11/29/2021 0906 by Nahla Lukin L, RN Outcome: Progressing Goal: Individualized Educational Video(s) 11/29/2021 1307 by Kacen Mellinger L, RN Outcome: Adequate for Discharge 11/29/2021 0906 by Jayci Ellefson L, RN Outcome: Progressing   Problem: Activity: Goal: Ability to avoid complications of mobility impairment will improve 11/29/2021 1307 by Ayaka Andes L, RN Outcome: Adequate for Discharge 11/29/2021 0906 by Jamisen Hawes L, RN Outcome: Progressing Goal: Ability to tolerate increased activity will improve 11/29/2021 1307 by Coe Angelos L, RN Outcome: Adequate for Discharge 11/29/2021 0906 by Tyara Dassow L, RN Outcome: Progressing   Problem: Clinical Measurements: Goal: Postoperative complications will be avoided or minimized 11/29/2021 1307 by Braian Tijerina L, RN Outcome: Adequate for Discharge 11/29/2021 0906 by Khayman Kirsch L, RN Outcome: Progressing   Problem: Pain Management: Goal: Pain level will decrease with appropriate interventions 11/29/2021 1307 by Dorian Renfro L, RN Outcome: Adequate for Discharge 11/29/2021 0906 by Kadin Canipe L, RN Outcome: Progressing   Problem: Skin Integrity: Goal: Will show signs of wound healing 11/29/2021 1307 by Graydon Fofana L, RN Outcome: Adequate for Discharge 11/29/2021 0906 by Rodarius Kichline L, RN Outcome: Progressing   Problem: Education: Goal: Knowledge of General Education information will improve Description: Including pain rating scale, medication(s)/side effects and non-pharmacologic comfort measures 11/29/2021 1307 by Pallas Wahlert L, RN Outcome: Adequate for Discharge 11/29/2021  0906 by Calley Drenning L, RN Outcome: Progressing   Problem: Health Behavior/Discharge Planning: Goal: Ability to manage health-related needs will improve 11/29/2021 1307 by Chiquetta Langner L, RN Outcome: Adequate for Discharge 11/29/2021 0906 by Yancy Knoble L, RN Outcome: Progressing   Problem: Clinical Measurements: Goal: Ability to maintain clinical measurements within normal limits will improve 11/29/2021 1307 by Jourdyn Ferrin L, RN Outcome: Adequate for Discharge 11/29/2021 0906 by Amedee Cerrone L, RN Outcome: Progressing Goal: Will remain free from infection 11/29/2021 1307 by Rhapsody Wolven L, RN Outcome: Adequate for Discharge 11/29/2021 0906 by Sui Kasparek L, RN Outcome: Progressing Goal: Diagnostic test results will improve 11/29/2021 1307 by Kannen Moxey L, RN Outcome: Adequate for Discharge 11/29/2021 0906 by Matsue Strom L, RN Outcome: Progressing Goal: Respiratory complications will improve 11/29/2021 1307 by Hayzel Ruberg L, RN Outcome: Adequate for Discharge 11/29/2021 0906 by Cordon Gassett L, RN Outcome: Progressing Goal: Cardiovascular complication will be avoided 11/29/2021 1307 by Vada Yellen L, RN Outcome: Adequate for Discharge 11/29/2021 0906 by Breiana Stratmann L, RN Outcome: Progressing   Problem: Activity: Goal: Risk for activity intolerance will decrease 11/29/2021 1307 by Madisyn Mawhinney L, RN Outcome: Adequate for Discharge 11/29/2021 0906 by Eliud Polo L, RN Outcome: Progressing   Problem: Nutrition: Goal: Adequate nutrition will be maintained 11/29/2021 1307 by Coleston Dirosa L, RN Outcome: Adequate for Discharge 11/29/2021 0906 by Tadarrius Burch L, RN Outcome: Progressing   Problem: Coping: Goal: Level of anxiety will decrease 11/29/2021 1307 by Tyonna Talerico L, RN Outcome: Adequate for Discharge 11/29/2021 0906 by Kennede Lusk L, RN Outcome: Progressing   Problem: Elimination: Goal: Will not experience complications related to bowel  motility 11/29/2021 1307 by Kinzlie Harney L, RN Outcome: Adequate for Discharge 11/29/2021 0906 by Santana Edell L, RN Outcome: Progressing Goal: Will not experience complications related to urinary retention 11/29/2021 1307 by Chayse Gracey L, RN  Outcome: Adequate for Discharge 11/29/2021 0906 by Kysa Calais L, RN Outcome: Progressing   Problem: Pain Managment: Goal: General experience of comfort will improve 11/29/2021 1307 by Runa Whittingham L, RN Outcome: Adequate for Discharge 11/29/2021 0906 by Ivonna Kinnick L, RN Outcome: Progressing   Problem: Safety: Goal: Ability to remain free from injury will improve 11/29/2021 1307 by Zakeya Junker L, RN Outcome: Adequate for Discharge 11/29/2021 0906 by Taelyn Broecker L, RN Outcome: Progressing   Problem: Skin Integrity: Goal: Risk for impaired skin integrity will decrease 11/29/2021 1307 by Marycarmen Hagey L, RN Outcome: Adequate for Discharge 11/29/2021 0906 by Ranell Skibinski L, RN Outcome: Progressing

## 2021-11-29 NOTE — Discharge Summary (Signed)
Physician Discharge Summary  Patient ID: [facility] MRN: [id number] DOB/AGE: 06-27-1945 76 y.o.  Admit date: 11/28/2021 Discharge date: 11/29/2021  Admission Diagnoses:  Osteoarthritis of right hip, unspecified osteoarthritis type  Discharge Diagnoses:  Principal Problem:   Osteoarthritis of right hip, unspecified osteoarthritis type   Past Medical History:  Diagnosis Date   Arthritis    hip   Bipolar 2 disorder ([place])    Cataracts, bilateral    slight   Cataracts, bilateral    Chronic kidney disease    COPD (chronic obstructive pulmonary disease) ([place])    " mild "   GERD (gastroesophageal reflux disease)    Hypertension    Macular degeneration of both eyes    L mod, R mild   Shortness of breath    with exertion. Pt smokes   TIA (transient ischemic attack) 05/06/2012    Surgeries: Procedure(s): TOTAL HIP ARTHROPLASTY on 11/28/2021   Consultants (if any):   Discharged Condition: Improved  Hospital Course: ABIGIAL [place] is an 76 y.o. female who was admitted 11/28/2021 with a diagnosis of Osteoarthritis of right hip, unspecified osteoarthritis type and went to the operating room on 11/28/2021 and underwent the above named procedures.    She was given perioperative antibiotics:  Anti-infectives (From admission, onward)    Start     Dose/Rate Route Frequency Ordered Stop   11/28/21 1700  ceFAZolin (ANCEF) IVPB 2g/100 mL premix        2 g 200 mL/hr over 30 Minutes Intravenous Every 6 hours 11/28/21 1429 11/29/21 0027   11/28/21 0815  ceFAZolin (ANCEF) IVPB 2g/100 mL premix        2 g 200 mL/hr over 30 Minutes Intravenous On call to O.R. 11/28/21 2947 11/28/21 1058     .  She was given sequential compression devices, early ambulation, and aspirin for DVT prophylaxis.  She benefited maximally from the hospital stay and there were no complications.    Recent vital signs:  Vitals:   11/29/21 0205 11/29/21 0633  BP: 117/89 129/62  Pulse: 80 83  Resp: 20 19   Temp: 97.7 F (36.5 C) 97.6 F (36.4 C)  SpO2: 92% 93%    Recent laboratory studies:  Lab Results  Component Value Date   HGB 13.1 11/29/2021   HGB 15.5 (H) 11/16/2021   HGB 15.3 (H) 06/09/2020   Lab Results  Component Value Date   WBC 10.3 11/29/2021   PLT 258 11/29/2021   Lab Results  Component Value Date   INR 0.88 05/05/2012   Lab Results  Component Value Date   NA 137 11/29/2021   K 4.3 11/29/2021   CL 105 11/29/2021   CO2 25 11/29/2021   BUN 10 11/29/2021   CREATININE 0.68 11/29/2021   GLUCOSE 134 (H) 11/29/2021    Discharge Medications:   Allergies as of 11/29/2021       Reactions   Codeine Nausea And Vomiting   Demerol [meperidine] Nausea And Vomiting   Effexor [venlafaxine Hcl] Other (See Comments)   Increase pressure   Doxycycline Rash   Pneumovax [pneumococcal Polysaccharide Vaccine] Rash   Sulfa Antibiotics Rash        Medication List     STOP taking these medications    traMADol 50 MG tablet Commonly known as: ULTRAM       TAKE these medications    acetaminophen 500 MG tablet Commonly known as: TYLENOL Take 500 mg by mouth every 4 (four) hours.   albuterol 108 (90 Base)  MCG/ACT inhaler Commonly known as: VENTOLIN HFA Inhale 2 puffs into the lungs every 6 (six) hours as needed for wheezing or shortness of breath (Break thru shortness of breath).   alendronate 70 MG tablet Commonly known as: FOSAMAX Take 70 mg by mouth once a week. Tuesday   amLODipine 10 MG tablet Commonly known as: NORVASC Take 10 mg by mouth daily.   aspirin EC 81 MG tablet Take 1 tablet (81 mg total) by mouth 2 (two) times daily for 28 days. Swallow whole.   atorvastatin 20 MG tablet Commonly known as: Lipitor Take 1 tablet (20 mg total) by mouth daily.   b complex vitamins capsule Take 1 capsule by mouth daily.   buPROPion 150 MG 12 hr tablet Commonly known as: WELLBUTRIN SR Take 300 mg by mouth daily.   Caltrate 600+D 600-20 MG-MCG  Tabs Generic drug: Calcium Carb-Cholecalciferol Take 1 tablet by mouth daily.   clonazePAM 1 MG tablet Commonly known as: KLONOPIN Take 1 tablet (1 mg) at bedtime What changed:  how much to take how to take this when to take this additional instructions   clopidogrel 75 MG tablet Commonly known as: PLAVIX Take 1 tablet (75 mg total) by mouth daily. Start taking on: November 30, 2021   Melatonin 10 MG Tabs Take 40 mg by mouth at bedtime.   meloxicam 15 MG tablet Commonly known as: MOBIC Take 1 tablet (15 mg total) by mouth daily for 14 days.   methocarbamol 500 MG tablet Commonly known as: ROBAXIN Take 1 tablet (500 mg total) by mouth every 8 (eight) hours as needed for up to 10 days for muscle spasms.   Nebulizer Devi by Does not apply route as needed.   ondansetron 4 MG tablet Commonly known as: Zofran Take 1 tablet (4 mg total) by mouth every 8 (eight) hours as needed for up to 14 days for nausea or vomiting.   oxyCODONE 5 MG immediate release tablet Commonly known as: Roxicodone Take 1 tablet (5 mg total) by mouth every 4 (four) hours as needed for up to 7 days for severe pain or moderate pain.   potassium chloride 10 MEQ tablet Commonly known as: KLOR-CON Take 10 mEq by mouth daily as needed (with Torosamide). with food   PreserVision AREDS 2 Caps Take 1 tablet by mouth 2 (two) times daily.   torsemide 10 MG tablet Commonly known as: DEMADEX Take 10 mg by mouth daily as needed for edema.   Trelegy Ellipta 200-62.5-25 MCG/ACT Aepb Generic drug: Fluticasone-Umeclidin-Vilant Inhale 1 puff into the lungs daily.   Vitamin D3 125 MCG (5000 UT) Tabs Take 5,000 Units by mouth daily.        Diagnostic Studies: DG HIP UNILAT WITH PELVIS 1V RIGHT  Result Date: 11/28/2021 CLINICAL DATA:  Status post right hip arthroplasty. EXAM: DG HIP (WITH OR WITHOUT PELVIS) 1V RIGHT COMPARISON:  None Available. FINDINGS: Total right hip arthroplasty is located without a  periprosthetic fracture. Densities along the right lateral hip soft tissues are compatible with postsurgical changes. Left hip appears to be located. Pelvic ring appears to be intact. IMPRESSION: Status post right hip arthroplasty without complicating features. Electronically Signed   By: Markus Daft M.D.   On: 11/28/2021 13:31   DG Pelvis Portable  Result Date: 11/28/2021 CLINICAL DATA:  Right hip arthroplasty. EXAM: PORTABLE PELVIS 1-2 VIEWS COMPARISON:  None Available. FINDINGS: Frontal projection obtained intraoperatively demonstrates normal alignment of a total right hip arthroplasty in the frontal projection. No evidence of  fracture or abnormal lucency surrounding the prosthesis. The bony pelvis is intact. IMPRESSION: Normal alignment of total right hip arthroplasty in the frontal projection. Electronically Signed   By: Aletta Edouard M.D.   On: 11/28/2021 12:06   MM DIAG BREAST TOMO BILATERAL  Result Date: 11/01/2021 CLINICAL DATA:  Patient presents for focal tenderness within the right axillary region. EXAM: DIGITAL DIAGNOSTIC BILATERAL MAMMOGRAM WITH TOMOSYNTHESIS; ULTRASOUND RIGHT BREAST LIMITED TECHNIQUE: Bilateral digital diagnostic mammography and breast tomosynthesis was performed.; Targeted ultrasound examination of the right breast was performed COMPARISON:  Previous exam(s). ACR Breast Density Category c: The breast tissue is heterogeneously dense, which may obscure small masses. FINDINGS: No concerning masses, calcifications or nonsurgical distortion identified within either breast. On physical exam, dense tissue is palpated upper-outer right breast Targeted ultrasound is performed, showing normal tissue without suspicious mass within the right breast 11 o'clock position 12 cm from the nipple the site of focal tenderness. IMPRESSION: No mammographic evidence for malignancy. No suspicious abnormality at the site of tenderness upper-outer right breast RECOMMENDATION: Continued clinical  evaluation for right breast tenderness Screening mammogram in one year.(Code:SM-B-01Y) I have discussed the findings and recommendations with the patient. If applicable, a reminder letter will be sent to the patient regarding the next appointment. BI-RADS CATEGORY  2: Benign. Electronically Signed   By: Lovey Newcomer M.D.   On: 11/01/2021 14:44   US BREAST LTD UNI RIGHT INC AXILLA  Result Date: 11/01/2021 CLINICAL DATA:  Patient presents for focal tenderness within the right axillary region. EXAM: DIGITAL DIAGNOSTIC BILATERAL MAMMOGRAM WITH TOMOSYNTHESIS; ULTRASOUND RIGHT BREAST LIMITED TECHNIQUE: Bilateral digital diagnostic mammography and breast tomosynthesis was performed.; Targeted ultrasound examination of the right breast was performed COMPARISON:  Previous exam(s). ACR Breast Density Category c: The breast tissue is heterogeneously dense, which may obscure small masses. FINDINGS: No concerning masses, calcifications or nonsurgical distortion identified within either breast. On physical exam, dense tissue is palpated upper-outer right breast Targeted ultrasound is performed, showing normal tissue without suspicious mass within the right breast 11 o'clock position 12 cm from the nipple the site of focal tenderness. IMPRESSION: No mammographic evidence for malignancy. No suspicious abnormality at the site of tenderness upper-outer right breast RECOMMENDATION: Continued clinical evaluation for right breast tenderness Screening mammogram in one year.(Code:SM-B-01Y) I have discussed the findings and recommendations with the patient. If applicable, a reminder letter will be sent to the patient regarding the next appointment. BI-RADS CATEGORY  2: Benign. Electronically Signed   By: Lovey Newcomer M.D.   On: 11/01/2021 14:44   Disposition: Discharge disposition: 01-Home or Self Care       Discharge Instructions     Call MD / Call 911   Complete by: As directed    If you experience chest pain or shortness  of breath, CALL 911 and be transported to the hospital emergency room.  If you develope a fever above 101 F, pus (white drainage) or increased drainage or redness at the wound, or calf pain, call your surgeon's office.   Constipation Prevention   Complete by: As directed    Drink plenty of fluids.  Prune juice may be helpful.  You may use a stool softener, such as Colace (over the counter) 100 mg twice a day.  Use MiraLax (over the counter) for constipation as needed.   Diet - low sodium heart healthy   Complete by: As directed    Increase activity slowly as tolerated   Complete by: As directed    Post-operative  opioid taper instructions:   Complete by: As directed    POST-OPERATIVE OPIOID TAPER INSTRUCTIONS: It is important to wean off of your opioid medication as soon as possible. If you do not need pain medication after your surgery it is ok to stop day one. Opioids include: Codeine, Hydrocodone(Norco, Vicodin), Oxycodone(Percocet, oxycontin) and hydromorphone amongst others.  Long term and even short term use of opiods can cause: Increased pain response Dependence Constipation Depression Respiratory depression And more.  Withdrawal symptoms can include Flu like symptoms Nausea, vomiting And more Techniques to manage these symptoms Hydrate well Eat regular healthy meals Stay active Use relaxation techniques(deep breathing, meditating, yoga) Do Not substitute Alcohol to help with tapering If you have been on opioids for less than two weeks and do not have pain than it is ok to stop all together.  Plan to wean off of opioids This plan should start within one week post op of your joint replacement. Maintain the same interval or time between taking each dose and first decrease the dose.  Cut the total daily intake of opioids by one tablet each day Next start to increase the time between doses. The last dose that should be eliminated is the evening dose.           Follow-up  Information     Willaim Sheng, MD. Go on 12/13/2021.   Specialty: Orthopedic Surgery Why: Your appointment is scheduled for 2:45. Contact information: [address] [address] [phone]         [facility], [place]. Go on 12/03/2021.   Why: your outpatient physical therapy is scheduled for 3:15. Please arrive at 3:00 to complete your paperwork Contact information: [facility] [place] [address] [phone]                    Discharge Instructions      INSTRUCTIONS AFTER JOINT REPLACEMENT   Remove items at home which could result in a fall. This includes throw rugs or furniture in walking pathways ICE to the affected joint every three hours while awake for 30 minutes at a time, for at least the first 3-5 days, and then as needed for pain and swelling.  Continue to use ice for pain and swelling. You may notice swelling that will progress down to the foot and ankle.  This is normal after surgery.  Elevate your leg when you are not up walking on it.   Continue to use the breathing machine you got in the hospital (incentive spirometer) which will help keep your temperature down.  It is common for your temperature to cycle up and down following surgery, especially at night when you are not up moving around and exerting yourself.  The breathing machine keeps your lungs expanded and your temperature down.   DIET:  As you were doing prior to hospitalization, we recommend a well-balanced diet.  DRESSING / WOUND CARE / SHOWERING  Keep the surgical dressing until follow up.  The dressing is water proof, so you can shower without any extra covering.  IF THE DRESSING FALLS OFF or the wound gets wet inside, change the dressing with sterile gauze.  Please use good hand washing techniques before changing the dressing.  Do not use any lotions or creams on the incision until instructed by your  surgeon.    ACTIVITY  Increase activity slowly as tolerated, but follow the weight bearing instructions below.   No driving  for 6 weeks or until further direction given by your physician.  You cannot drive while taking narcotics.  No lifting or carrying greater than 10 lbs. until further directed by your surgeon. Avoid periods of inactivity such as sitting longer than an hour when not asleep. This helps prevent blood clots.  You may return to work once you are authorized by your doctor.     WEIGHT BEARING   Weight bearing as tolerated with assist device (walker, cane, etc) as directed, use it as long as suggested by your surgeon or therapist, typically at least 4-6 weeks.   EXERCISES  Results after joint replacement surgery are often greatly improved when you follow the exercise, range of motion and muscle strengthening exercises prescribed by your doctor. Safety measures are also important to protect the joint from further injury. Any time any of these exercises cause you to have increased pain or swelling, decrease what you are doing until you are comfortable again and then slowly increase them. If you have problems or questions, call your caregiver or physical therapist for advice.   Rehabilitation is important following a joint replacement. After just a few days of immobilization, the muscles of the leg can become weakened and shrink (atrophy).  These exercises are designed to build up the tone and strength of the thigh and leg muscles and to improve motion. Often times heat used for twenty to thirty minutes before working out will loosen up your tissues and help with improving the range of motion but do not use heat for the first two weeks following surgery (sometimes heat can increase post-operative swelling).   These exercises can be done on a training (exercise) mat, on the floor, on a table or on a bed. Use whatever works the best and is most comfortable for you.    Use music or  television while you are exercising so that the exercises are a pleasant break in your day. This will make your life better with the exercises acting as a break in your routine that you can look forward to.   Perform all exercises about fifteen times, three times per day or as directed.  You should exercise both the operative leg and the other leg as well.  Exercises include:   Quad Sets - Tighten up the muscle on the front of the thigh (Quad) and hold for 5-10 seconds.   Straight Leg Raises - With your knee straight (if you were given a brace, keep it on), lift the leg to 60 degrees, hold for 3 seconds, and slowly lower the leg.  Perform this exercise against resistance later as your leg gets stronger.  Leg Slides: Lying on your back, slowly slide your foot toward your buttocks, bending your knee up off the floor (only go as far as is comfortable). Then slowly slide your foot back down until your leg is flat on the floor again.  Angel Wings: Lying on your back spread your legs to the side as far apart as you can without causing discomfort.  Hamstring Strength:  Lying on your back, push your heel against the floor with your leg straight by tightening up the muscles of your buttocks.  Repeat, but this time bend your knee to a comfortable angle, and push your heel against the floor.  You may put a pillow under the heel to make it more comfortable if necessary.   A rehabilitation program following joint replacement surgery can speed recovery and prevent re-injury in the future due  to weakened muscles. Contact your doctor or a physical therapist for more information on knee rehabilitation.    CONSTIPATION  Constipation is defined medically as fewer than three stools per week and severe constipation as less than one stool per week.  Even if you have a regular bowel pattern at home, your normal regimen is likely to be disrupted due to multiple reasons following surgery.  Combination of anesthesia,  postoperative narcotics, change in appetite and fluid intake all can affect your bowels.   YOU MUST use at least one of the following options; they are listed in order of increasing strength to get the job done.  They are all available over the counter, and you may need to use some, POSSIBLY even all of these options:    Drink plenty of fluids (prune juice may be helpful) and high fiber foods Colace 100 mg by mouth twice a day  Senokot for constipation as directed and as needed Dulcolax (bisacodyl), take with full glass of water  Miralax (polyethylene glycol) once or twice a day as needed.  If you have tried all these things and are unable to have a bowel movement in the first 3-4 days after surgery call either your surgeon or your primary doctor.    If you experience loose stools or diarrhea, hold the medications until you stool forms back up.  If your symptoms do not get better within 1 week or if they get worse, check with your doctor.  If you experience "the worst abdominal pain ever" or develop nausea or vomiting, please contact the office immediately for further recommendations for treatment.   ITCHING:  If you experience itching with your medications, try taking only a single pain pill, or even half a pain pill at a time.  You can also use Benadryl over the counter for itching or also to help with sleep.   TED HOSE STOCKINGS:  Use stockings on both legs until for at least 2 weeks or as directed by physician office. They may be removed at night for sleeping.  MEDICATIONS:  See your medication summary on the "After Visit Summary" that nursing will review with you.  You may have some home medications which will be placed on hold until you complete the course of blood thinner medication.  It is important for you to complete the blood thinner medication as prescribed.   Blood clot prevention (DVT Prophylaxis): After surgery you are at an increased risk for a blood clot. you were prescribed a  blood thinner, Aspirin '81mg'$ , to be taken twice daily for a total of 4 weeks from surgery to help reduce your risk of getting a blood clot. This will help prevent a blood clot. Signs of a pulmonary embolus (blood clot in the lungs) include sudden short of breath, feeling lightheaded or dizzy, chest pain with a deep breath, rapid pulse rapid breathing. Signs of a blood clot in your arms or legs include new unexplained swelling and cramping, warm, red or darkened skin around the painful area. Please call the office or 911 right away if these signs or symptoms develop.  PRECAUTIONS:  If you experience chest pain or shortness of breath - call 911 immediately for transfer to the hospital emergency department.   If you develop a fever greater that 101 F, purulent drainage from wound, increased redness or drainage from wound, foul odor from the wound/dressing, or calf pain - CONTACT YOUR SURGEON.  FOLLOW-UP APPOINTMENTS:  If you do not already have a post-op appointment, please call the office for an appointment to be seen by your surgeon.  Guidelines for how soon to be seen are listed in your "After Visit Summary", but are typically between 2-3 weeks after surgery.  POST-OPERATIVE OPIOID TAPER INSTRUCTIONS: It is important to wean off of your opioid medication as soon as possible. If you do not need pain medication after your surgery it is ok to stop day one. Opioids include: Codeine, Hydrocodone(Norco, Vicodin), Oxycodone(Percocet, oxycontin) and hydromorphone amongst others.  Long term and even short term use of opiods can cause: Increased pain response Dependence Constipation Depression Respiratory depression And more.  Withdrawal symptoms can include Flu like symptoms Nausea, vomiting And more Techniques to manage these symptoms Hydrate well Eat regular healthy meals Stay active Use relaxation techniques(deep breathing, meditating, yoga) Do  Not substitute Alcohol to help with tapering If you have been on opioids for less than two weeks and do not have pain than it is ok to stop all together.  Plan to wean off of opioids This plan should start within one week post op of your joint replacement. Maintain the same interval or time between taking each dose and first decrease the dose.  Cut the total daily intake of opioids by one tablet each day Next start to increase the time between doses. The last dose that should be eliminated is the evening dose.   MAKE SURE YOU:  Understand these instructions.  Get help right away if you are not doing well or get worse.    Thank you for letting us be a part of your medical care team.  It is a privilege we respect greatly.  We hope these instructions will help you stay on track for a fast and full recovery!            Signed: Milee Qualls A Lilymarie Scroggins 11/29/2021, 7:29 AM

## 2021-11-29 NOTE — Progress Notes (Signed)
     Subjective: Patient doing well this morning.  She reports she was able to get up to the bedside commode with little difficulty.  She was very lethargic and drowsy yesterday after surgery but today is much more alert.  Eager to work more with therapy today.  Hopeful for discharge home.  No distal numbness and tingling.  No concerns. Objective:   VITALS:   Vitals:   11/28/21 1435 11/28/21 2154 11/29/21 0205 11/29/21 0633  BP: 129/62 126/66 117/89 129/62  Pulse: 75 (!) 102 80 83  Resp: '15 17 20 19  '$ Temp: 97.6 F (36.4 C) 98.6 F (37 C) 97.7 F (36.5 C) 97.6 F (36.4 C)  TempSrc: Axillary     SpO2: 94% 93% 92% 93%  Weight:      Height:        Sensation intact distally Intact pulses distally Dorsiflexion/Plantar flexion intact Incision: dressing C/D/I Compartment soft   Lab Results  Component Value Date   WBC 10.3 11/29/2021   HGB 13.1 11/29/2021   HCT 38.8 11/29/2021   MCV 96.0 11/29/2021   PLT 258 11/29/2021   BMET    Component Value Date/Time   NA 137 11/29/2021 0337   K 4.3 11/29/2021 0337   CL 105 11/29/2021 0337   CO2 25 11/29/2021 0337   GLUCOSE 134 (H) 11/29/2021 0337   BUN 10 11/29/2021 0337   CREATININE 0.68 11/29/2021 0337   CALCIUM 8.8 (L) 11/29/2021 0337   GFRNONAA >60 11/29/2021 3244    Xray: Pelvis and hip x-rays in PACU demonstrate total of arthroplasty components good position no adverse features  Assessment/Plan: 1 Day Post-Op   Principal Problem:   Osteoarthritis of right hip, unspecified osteoarthritis type   S/p R THA for OA 9/27  Post op recs: WB: WBAT RLE, No formal hip precautions Abx: ancef Imaging: PACU pelvis Xray Dressing: Aquacell, keep intact until follow up DVT prophylaxis: Aspirin 81BID starting POD1, resume plavix POD2 Follow up: 2 weeks after surgery for a wound check with Dr. Zachery Dakins at [facility].  Address: [address] [place], [place], [place] [address]  Office Phone: [phone]    Willaim Sheng 11/29/2021, 7:27 AM   Charlies Constable, MD  Contact information:   [phone] 7am-5pm epic message Dr. Zachery Dakins, or call office for patient follow up: [phone] After hours and holidays please check [email or web address] for group call information for [facility]

## 2021-11-29 NOTE — Plan of Care (Signed)
  Problem: Education: Goal: Knowledge of the prescribed therapeutic regimen will improve Outcome: Progressing Goal: Understanding of discharge needs will improve Outcome: Progressing Goal: Individualized Educational Video(s) Outcome: Progressing   Problem: Activity: Goal: Ability to avoid complications of mobility impairment will improve Outcome: Progressing Goal: Ability to tolerate increased activity will improve Outcome: Progressing   Problem: Clinical Measurements: Goal: Postoperative complications will be avoided or minimized Outcome: Progressing   Problem: Pain Management: Goal: Pain level will decrease with appropriate interventions Outcome: Progressing   Problem: Skin Integrity: Goal: Will show signs of wound healing Outcome: Progressing   Problem: Education: Goal: Knowledge of General Education information will improve Description: Including pain rating scale, medication(s)/side effects and non-pharmacologic comfort measures Outcome: Progressing   Problem: Health Behavior/Discharge Planning: Goal: Ability to manage health-related needs will improve Outcome: Progressing   Problem: Clinical Measurements: Goal: Ability to maintain clinical measurements within normal limits will improve Outcome: Progressing Goal: Will remain free from infection Outcome: Progressing Goal: Diagnostic test results will improve Outcome: Progressing Goal: Respiratory complications will improve Outcome: Progressing Goal: Cardiovascular complication will be avoided Outcome: Progressing   Problem: Activity: Goal: Risk for activity intolerance will decrease Outcome: Progressing   Problem: Nutrition: Goal: Adequate nutrition will be maintained Outcome: Progressing   Problem: Elimination: Goal: Will not experience complications related to bowel motility Outcome: Progressing Goal: Will not experience complications related to urinary retention Outcome: Progressing   Problem: Pain  Managment: Goal: General experience of comfort will improve Outcome: Progressing   Problem: Safety: Goal: Ability to remain free from injury will improve Outcome: Progressing   Problem: Skin Integrity: Goal: Risk for impaired skin integrity will decrease Outcome: Progressing

## 2021-11-29 NOTE — Progress Notes (Signed)
Physical Therapy Treatment Patient Details Name: Caitlin Galloway MRN: [id number] DOB: October 14, 1945 Today's Date: 11/29/2021   History of Present Illness Pt is a 76yo female presenting s/p R-THA, posterior approach on 11/28/21. No formal hip precautions. PMH: bipolar, CKd, COPD, GERD, HTN, macular degeneration of both eyes, hx of TIA 2014, tobacco abuse.    PT Comments    Pt motivated and progressing well with mobility.  Pt up to ambulate increased distance in hall and HEP initiated.   Recommendations for follow up therapy are one component of a multi-disciplinary discharge planning process, led by the attending physician.  Recommendations may be updated based on patient status, additional functional criteria and insurance authorization.  Follow Up Recommendations  Follow physician's recommendations for discharge plan and follow up therapies     Assistance Recommended at Discharge Intermittent Supervision/Assistance  Patient can return home with the following A little help with walking and/or transfers;A little help with bathing/dressing/bathroom;Assistance with cooking/housework;Assist for transportation;Help with stairs or ramp for entrance   Equipment Recommendations  None recommended by PT    Recommendations for Other Services       Precautions / Restrictions Precautions Precautions: Fall Restrictions Weight Bearing Restrictions: No RLE Weight Bearing: Weight bearing as tolerated     Mobility  Bed Mobility Overal bed mobility: Needs Assistance Bed Mobility: Supine to Sit     Supine to sit: Min assist     General bed mobility comments: INcreased time with cues for sequence and Min assist to bring RLE off bed    Transfers Overall transfer level: Needs assistance Equipment used: Rolling walker (2 wheels) Transfers: Sit to/from Stand Sit to Stand: Min guard           General transfer comment: cues for LE management and use of UEs to self assist     Ambulation/Gait Ambulation/Gait assistance: Min assist, Min guard Gait Distance (Feet): 100 Feet Assistive device: Rolling walker (2 wheels) Gait Pattern/deviations: Step-to pattern, Decreased step length - right, Decreased step length - left, Shuffle, Trunk flexed Gait velocity: decr     General Gait Details: cues for sequence, posture and position from [employer]             Wheelchair Mobility    Modified Rankin (Stroke Patients Only)       Balance Overall balance assessment: Needs assistance Sitting-balance support: Feet supported, No upper extremity supported Sitting balance-Leahy Scale: Good     Standing balance support: Single extremity supported Standing balance-Leahy Scale: Poor                              Cognition Arousal/Alertness: Awake/alert Behavior During Therapy: WFL for tasks assessed/performed Overall Cognitive Status: Within Functional Limits for tasks assessed                                          Exercises Total Joint Exercises Ankle Circles/Pumps: AROM, Both, 20 reps Quad Sets: AROM, Both, 10 reps, Supine Heel Slides: AAROM, Right, 20 reps, Supine Hip ABduction/ADduction: AAROM, Left, 15 reps, Supine Long Arc Quad: AROM, Left, 10 reps, Seated    General Comments        Pertinent Vitals/Pain      Home Living  Prior Function            PT Goals (current goals can now be found in the care plan section) Acute Rehab PT Goals Patient Stated Goal: Yardwork PT Goal Formulation: With patient/family Time For Goal Achievement: 12/05/21 Potential to Achieve Goals: Good Progress towards PT goals: Progressing toward goals    Frequency    7X/week      PT Plan Current plan remains appropriate    Co-evaluation              AM-PAC PT "6 Clicks" Mobility   Outcome Measure  Help needed turning from your back to your side while in a flat bed without  using bedrails?: A Little Help needed moving from lying on your back to sitting on the side of a flat bed without using bedrails?: A Little Help needed moving to and from a bed to a chair (including a wheelchair)?: A Little Help needed standing up from a chair using your arms (e.g., wheelchair or bedside chair)?: A Little Help needed to walk in hospital room?: A Little Help needed climbing 3-5 steps with a railing? : A Little 6 Click Score: 18    End of Session Equipment Utilized During Treatment: Gait belt Activity Tolerance: Patient tolerated treatment well Patient left: in chair;with call bell/phone within reach;with chair alarm set Nurse Communication: Mobility status PT Visit Diagnosis: Pain;Difficulty in walking, not elsewhere classified (R26.2) Pain - Right/Left: Right Pain - part of body: Hip     Time: 9485-4627 PT Time Calculation (min) (ACUTE ONLY): 31 min  Charges:  $Gait Training: 8-22 mins $Therapeutic Exercise: 8-22 mins                     [place] Pager [phone] Office [phone]    Andrey Mccaskill 11/29/2021, 1:28 PM

## 2021-11-29 NOTE — Progress Notes (Addendum)
Physical Therapy Treatment Patient Details Name: Caitlin Galloway MRN: [id number] DOB: [birth year] Today's Date: 11/29/2021   History of Present Illness Pt is a 76yo female presenting s/p R-THA, posterior approach on 11/28/21. No formal hip precautions. PMH: bipolar, CKd, COPD, GERD, HTN, macular degeneration of both eyes, hx of TIA 2014, tobacco abuse.    PT Comments    Pt continues to progress well with mobility - pt up to walk in hallway, negotiated stairs, reviewed bed mobility, reviewed car transfers, and performed HEP with written instruction provided and reviewed.  Pt eager for dc home this date and confirms OP PT to start 12/04/21.  Recommendations for follow up therapy are one component of a multi-disciplinary discharge planning process, led by the attending physician.  Recommendations may be updated based on patient status, additional functional criteria and insurance authorization.  Follow Up Recommendations  Follow physician's recommendations for discharge plan and follow up therapies     Assistance Recommended at Discharge Intermittent Supervision/Assistance  Patient can return home with the following A little help with walking and/or transfers;A little help with bathing/dressing/bathroom;Assistance with cooking/housework;Assist for transportation;Help with stairs or ramp for entrance   Equipment Recommendations  None recommended by PT    Recommendations for Other Services       Precautions / Restrictions Precautions Precautions: Fall Restrictions Weight Bearing Restrictions: No RLE Weight Bearing: Weight bearing as tolerated     Mobility  Bed Mobility Overal bed mobility: Needs Assistance Bed Mobility: Supine to Sit, Sit to Supine     Supine to sit: Min guard Sit to supine: Min assist   General bed mobility comments: INcreased time with cues for sequence and Min assist to bring RLE onto bed    Transfers Overall transfer level: Needs assistance Equipment used:  Rolling walker (2 wheels) Transfers: Sit to/from Stand Sit to Stand: Min guard, Supervision           General transfer comment: cues for LE management and use of UEs to self assist    Ambulation/Gait Ambulation/Gait assistance: Min guard, Supervision Gait Distance (Feet): 65 Feet Assistive device: Rolling walker (2 wheels) Gait Pattern/deviations: Step-to pattern, Decreased step length - right, Decreased step length - left, Shuffle, Trunk flexed Gait velocity: decr     General Gait Details: cues for sequence, posture and position from RW   Stairs Stairs: Yes Stairs assistance: Min assist Stair Management: One rail Left, Step to pattern, Forwards, With cane Number of Stairs: 5 General stair comments: Pt uses brick ledge at home vs rail but reports can place significant wt on it.  CUes for sequence and foot/cane placement   Wheelchair Mobility    Modified Rankin (Stroke Patients Only)       Balance Overall balance assessment: Needs assistance Sitting-balance support: Feet supported, No upper extremity supported Sitting balance-Leahy Scale: Good     Standing balance support: No upper extremity supported Standing balance-Leahy Scale: Fair                              Cognition Arousal/Alertness: Awake/alert Behavior During Therapy: WFL for tasks assessed/performed Overall Cognitive Status: Within Functional Limits for tasks assessed                                          Exercises Total Joint Exercises Ankle Circles/Pumps: AROM, 10 reps, Supine, Both Quad  Sets: AROM, Both, 10 reps, Supine Heel Slides: AAROM, Right, Supine, 10 reps Hip ABduction/ADduction: AAROM, Left, Supine, 10 reps Long Arc Quad: AROM, Left, 10 reps, Seated    General Comments        Pertinent Vitals/Pain Pain Assessment Pain Assessment: 0-10 Pain Score: 4  Pain Location: R hip/groin Pain Descriptors / Indicators: Aching, Sore Pain Intervention(s):  Limited activity within patient's tolerance, Monitored during session, Premedicated before session, Ice applied    Home Living                          Prior Function            PT Goals (current goals can now be found in the care plan section) Acute Rehab PT Goals Patient Stated Goal: Yardwork PT Goal Formulation: With patient/family Time For Goal Achievement: 12/05/21 Potential to Achieve Goals: Good Progress towards PT goals: Progressing toward goals    Frequency    7X/week      PT Plan Current plan remains appropriate    Co-evaluation              AM-PAC PT "6 Clicks" Mobility   Outcome Measure  Help needed turning from your back to your side while in a flat bed without using bedrails?: A Little Help needed moving from lying on your back to sitting on the side of a flat bed without using bedrails?: A Little Help needed moving to and from a bed to a chair (including a wheelchair)?: A Little Help needed standing up from a chair using your arms (e.g., wheelchair or bedside chair)?: A Little Help needed to walk in hospital room?: A Little Help needed climbing 3-5 steps with a railing? : A Little 6 Click Score: 18    End of Session Equipment Utilized During Treatment: Gait belt Activity Tolerance: Patient tolerated treatment well Patient left: in bed;with call bell/phone within reach;with family/visitor present Nurse Communication: Mobility status PT Visit Diagnosis: Pain;Difficulty in walking, not elsewhere classified (R26.2) Pain - Right/Left: Right Pain - part of body: Hip     Time: 1212-1250 PT Time Calculation (min) (ACUTE ONLY): 38 min  Charges:  $Gait Training: 8-22 mins $Therapeutic Exercise: 8-22 mins $Therapeutic Activity: 8-22 mins                     [place] Pager [phone] Office [phone]    Caitlin Galloway 11/29/2021, 1:34 PM

## 2021-11-30 ENCOUNTER — Encounter (HOSPITAL_COMMUNITY): Payer: Self-pay | Admitting: Orthopedic Surgery

## 2021-12-03 DIAGNOSIS — M1611 Unilateral primary osteoarthritis, right hip: Secondary | ICD-10-CM | POA: Diagnosis not present

## 2021-12-03 DIAGNOSIS — Z96641 Presence of right artificial hip joint: Secondary | ICD-10-CM | POA: Diagnosis not present

## 2021-12-03 DIAGNOSIS — R262 Difficulty in walking, not elsewhere classified: Secondary | ICD-10-CM | POA: Diagnosis not present

## 2021-12-03 DIAGNOSIS — M6281 Muscle weakness (generalized): Secondary | ICD-10-CM | POA: Diagnosis not present

## 2021-12-10 DIAGNOSIS — M6281 Muscle weakness (generalized): Secondary | ICD-10-CM | POA: Diagnosis not present

## 2021-12-10 DIAGNOSIS — Z96641 Presence of right artificial hip joint: Secondary | ICD-10-CM | POA: Diagnosis not present

## 2021-12-10 DIAGNOSIS — R262 Difficulty in walking, not elsewhere classified: Secondary | ICD-10-CM | POA: Diagnosis not present

## 2021-12-10 DIAGNOSIS — M1611 Unilateral primary osteoarthritis, right hip: Secondary | ICD-10-CM | POA: Diagnosis not present

## 2021-12-12 DIAGNOSIS — R262 Difficulty in walking, not elsewhere classified: Secondary | ICD-10-CM | POA: Diagnosis not present

## 2021-12-12 DIAGNOSIS — Z96641 Presence of right artificial hip joint: Secondary | ICD-10-CM | POA: Diagnosis not present

## 2021-12-12 DIAGNOSIS — M6281 Muscle weakness (generalized): Secondary | ICD-10-CM | POA: Diagnosis not present

## 2021-12-12 DIAGNOSIS — M1611 Unilateral primary osteoarthritis, right hip: Secondary | ICD-10-CM | POA: Diagnosis not present

## 2021-12-13 DIAGNOSIS — M1611 Unilateral primary osteoarthritis, right hip: Secondary | ICD-10-CM | POA: Diagnosis not present

## 2021-12-19 ENCOUNTER — Telehealth: Payer: Self-pay

## 2021-12-19 NOTE — Patient Outreach (Signed)
  Care Coordination   12/19/2021 Name: Caitlin Galloway MRN: [id number] DOB: May 17, 1945   Care Coordination Outreach Attempts:  A second unsuccessful outreach was attempted today to offer the patient with information about available care coordination services as a benefit of their health plan.     Follow Up Plan:  Additional outreach attempts will be made to offer the patient care coordination information and services.   Encounter Outcome:  No Answer  Care Coordination Interventions Activated:  No   Care Coordination Interventions:  No, not indicated    [facility] [phone]

## 2022-01-02 DIAGNOSIS — H43813 Vitreous degeneration, bilateral: Secondary | ICD-10-CM | POA: Diagnosis not present

## 2022-01-02 DIAGNOSIS — H353112 Nonexudative age-related macular degeneration, right eye, intermediate dry stage: Secondary | ICD-10-CM | POA: Diagnosis not present

## 2022-01-02 DIAGNOSIS — H4322 Crystalline deposits in vitreous body, left eye: Secondary | ICD-10-CM | POA: Diagnosis not present

## 2022-01-02 DIAGNOSIS — H353221 Exudative age-related macular degeneration, left eye, with active choroidal neovascularization: Secondary | ICD-10-CM | POA: Diagnosis not present

## 2022-01-10 DIAGNOSIS — M1611 Unilateral primary osteoarthritis, right hip: Secondary | ICD-10-CM | POA: Diagnosis not present

## 2022-04-09 DIAGNOSIS — M1611 Unilateral primary osteoarthritis, right hip: Secondary | ICD-10-CM | POA: Diagnosis not present

## 2022-04-11 DIAGNOSIS — H43813 Vitreous degeneration, bilateral: Secondary | ICD-10-CM | POA: Diagnosis not present

## 2022-04-11 DIAGNOSIS — H35033 Hypertensive retinopathy, bilateral: Secondary | ICD-10-CM | POA: Diagnosis not present

## 2022-04-11 DIAGNOSIS — H43393 Other vitreous opacities, bilateral: Secondary | ICD-10-CM | POA: Diagnosis not present

## 2022-04-11 DIAGNOSIS — H353112 Nonexudative age-related macular degeneration, right eye, intermediate dry stage: Secondary | ICD-10-CM | POA: Diagnosis not present

## 2022-04-11 DIAGNOSIS — H353221 Exudative age-related macular degeneration, left eye, with active choroidal neovascularization: Secondary | ICD-10-CM | POA: Diagnosis not present

## 2022-04-18 DIAGNOSIS — E871 Hypo-osmolality and hyponatremia: Secondary | ICD-10-CM | POA: Diagnosis not present

## 2022-04-18 DIAGNOSIS — M5136 Other intervertebral disc degeneration, lumbar region: Secondary | ICD-10-CM | POA: Diagnosis not present

## 2022-04-18 DIAGNOSIS — Z0000 Encounter for general adult medical examination without abnormal findings: Secondary | ICD-10-CM | POA: Diagnosis not present

## 2022-04-18 DIAGNOSIS — E7800 Pure hypercholesterolemia, unspecified: Secondary | ICD-10-CM | POA: Diagnosis not present

## 2022-04-18 DIAGNOSIS — J449 Chronic obstructive pulmonary disease, unspecified: Secondary | ICD-10-CM | POA: Diagnosis not present

## 2022-04-18 DIAGNOSIS — G459 Transient cerebral ischemic attack, unspecified: Secondary | ICD-10-CM | POA: Diagnosis not present

## 2022-04-18 DIAGNOSIS — R7303 Prediabetes: Secondary | ICD-10-CM | POA: Diagnosis not present

## 2022-04-18 DIAGNOSIS — N183 Chronic kidney disease, stage 3 unspecified: Secondary | ICD-10-CM | POA: Diagnosis not present

## 2022-04-18 DIAGNOSIS — M810 Age-related osteoporosis without current pathological fracture: Secondary | ICD-10-CM | POA: Diagnosis not present

## 2022-04-18 DIAGNOSIS — I10 Essential (primary) hypertension: Secondary | ICD-10-CM | POA: Diagnosis not present

## 2022-08-07 DIAGNOSIS — H35033 Hypertensive retinopathy, bilateral: Secondary | ICD-10-CM | POA: Diagnosis not present

## 2022-08-07 DIAGNOSIS — H43813 Vitreous degeneration, bilateral: Secondary | ICD-10-CM | POA: Diagnosis not present

## 2022-08-07 DIAGNOSIS — H353221 Exudative age-related macular degeneration, left eye, with active choroidal neovascularization: Secondary | ICD-10-CM | POA: Diagnosis not present

## 2022-08-07 DIAGNOSIS — H353112 Nonexudative age-related macular degeneration, right eye, intermediate dry stage: Secondary | ICD-10-CM | POA: Diagnosis not present

## 2022-08-07 DIAGNOSIS — H43393 Other vitreous opacities, bilateral: Secondary | ICD-10-CM | POA: Diagnosis not present

## 2022-10-15 IMAGING — US US EXTREM LOW VENOUS*R*
1 series · 13 of 24 positions shown · non-contrast
Comparison: None Available.

CLINICAL DATA: EDEMA, right groin pain for 3 weeks



[Series 1: us extrem low venous*right* · 0.08mm/px · 13 of 32 slices shown]
[im 1/32]
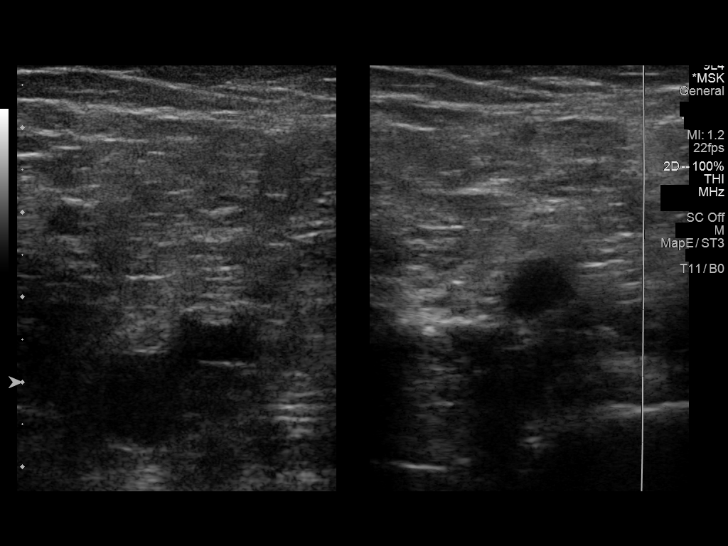
[im 3/32]
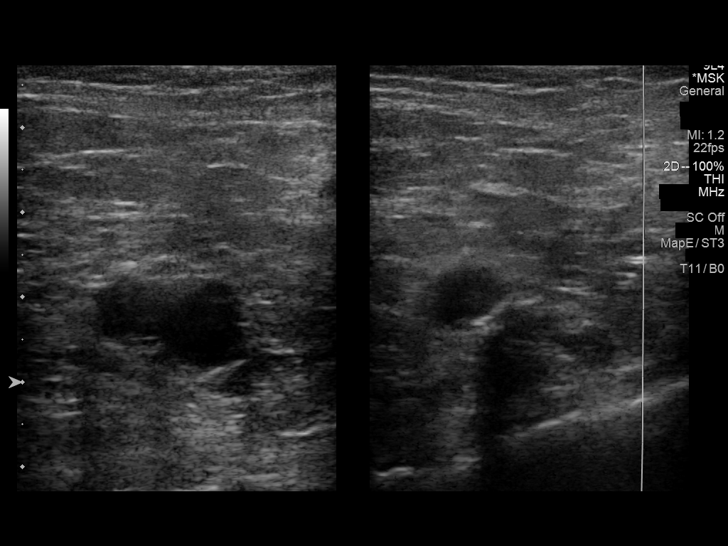
[im 6/32]
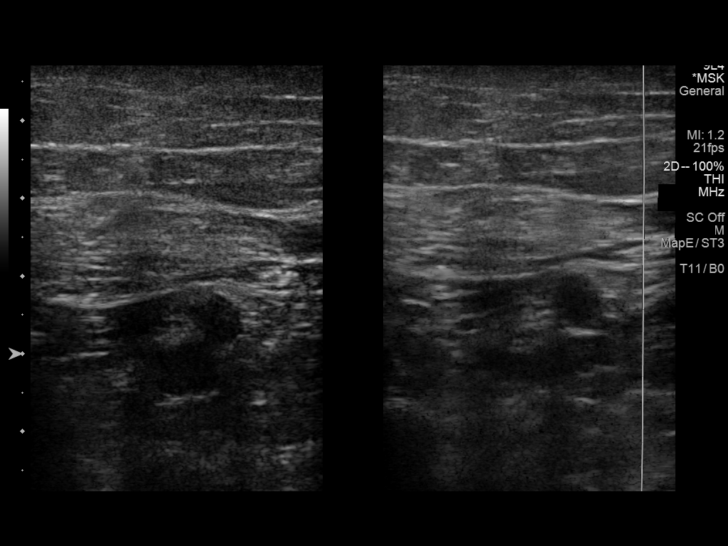
[im 9/32]
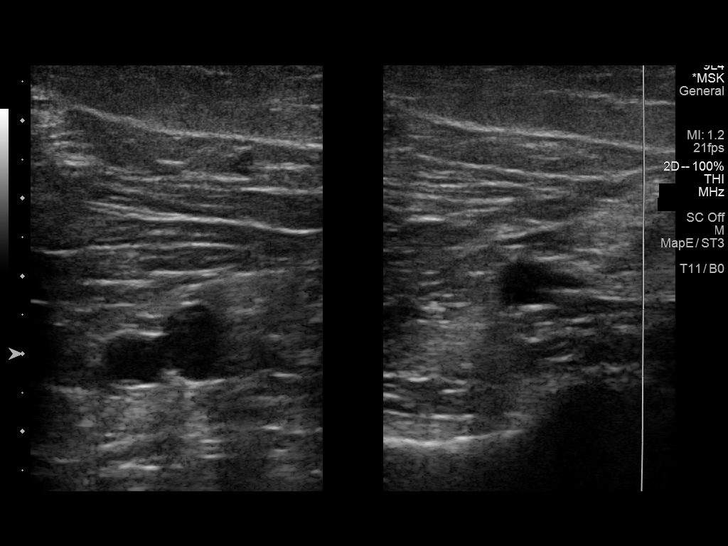
[im 11/32]
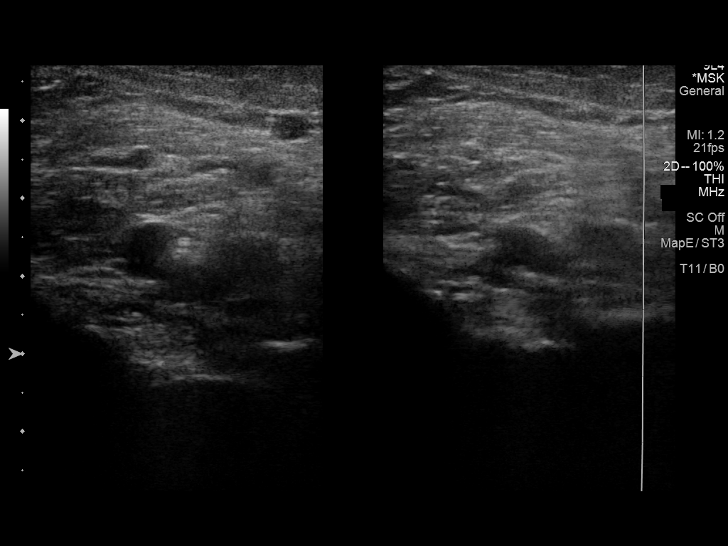
[im 14/32]
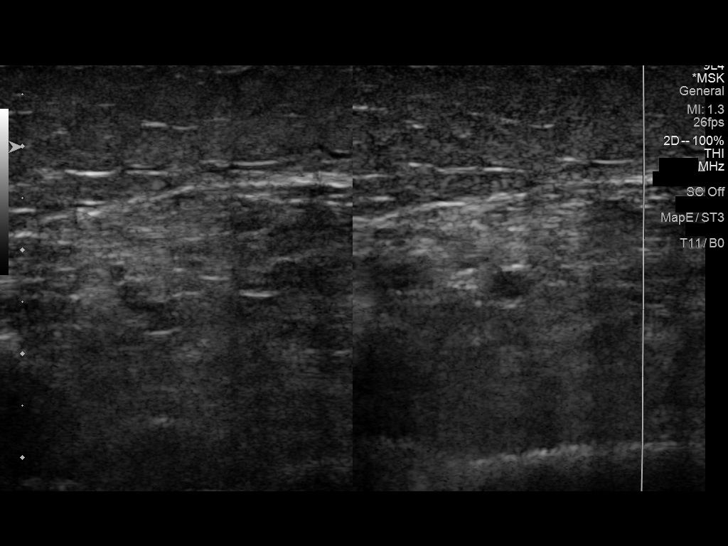
[im 17/32]
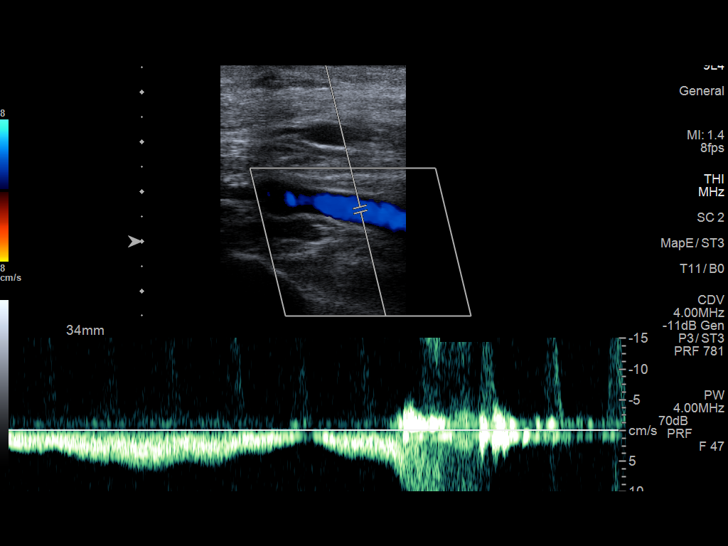
[im 18/32]
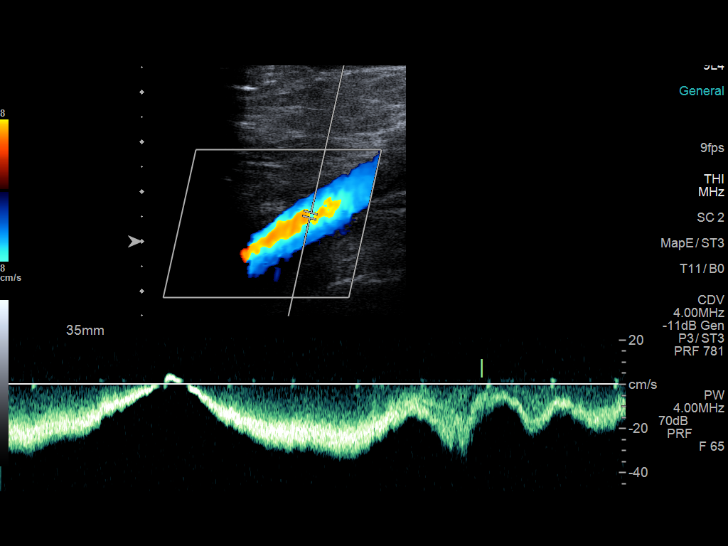
[im 21/32]
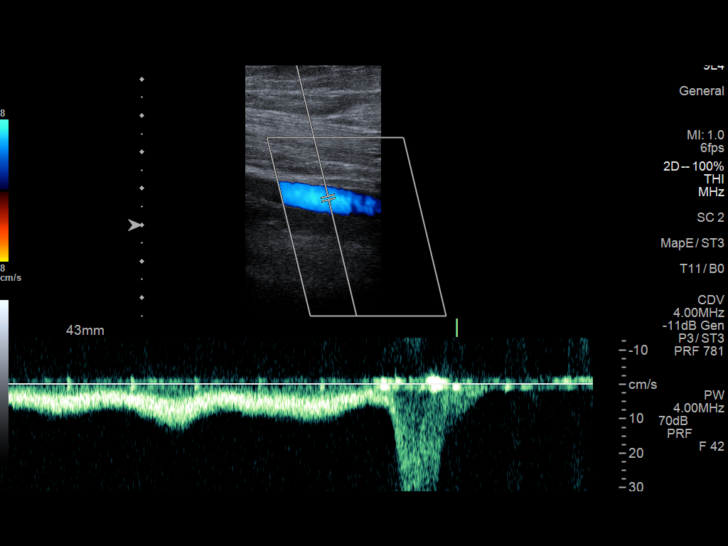
[im 23/32]
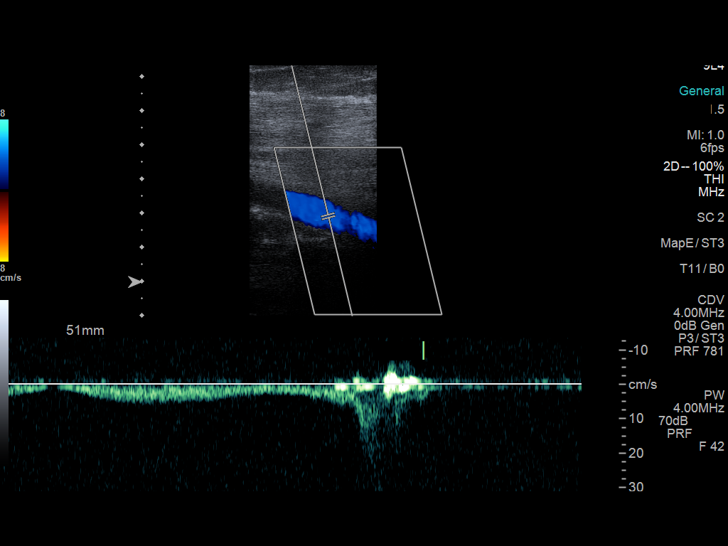
[im 26/32]
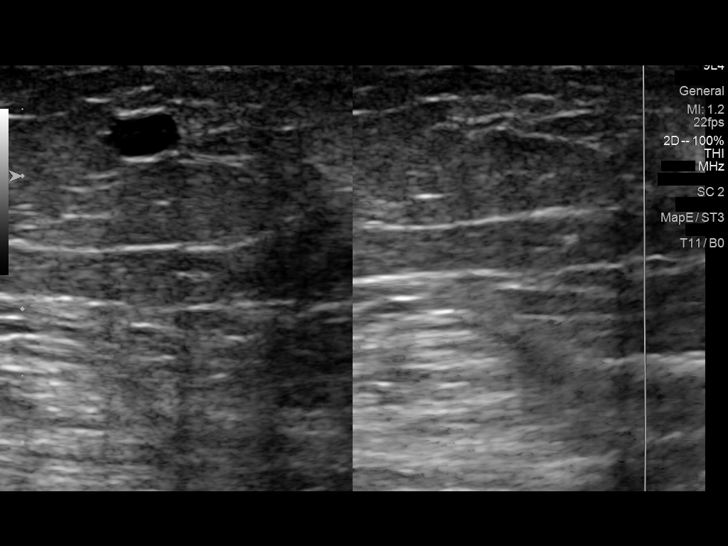
[im 29/32]
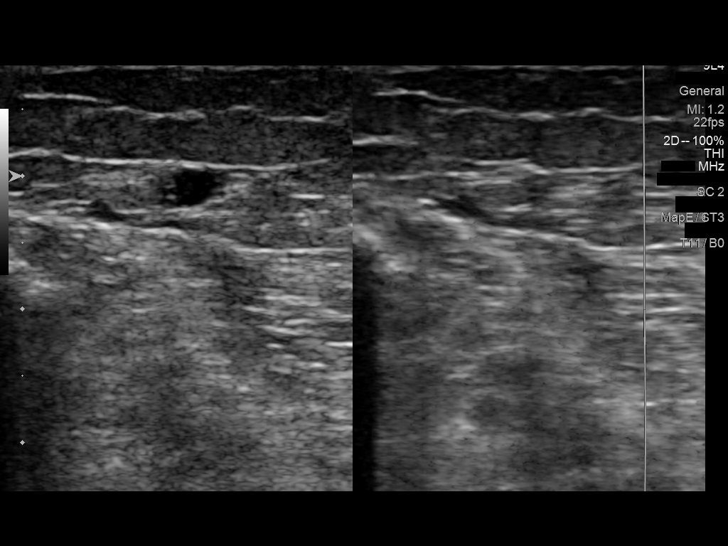
[im 32/32]
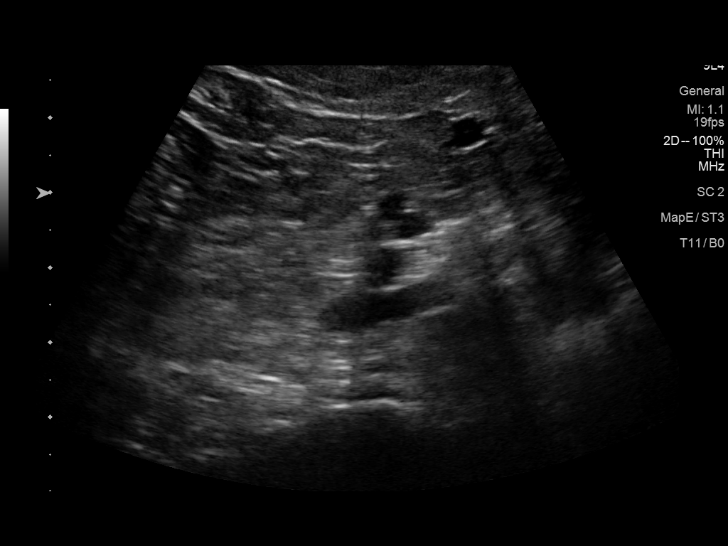

[13 of 24 positions shown; findings below may reference images not displayed]

FINDINGS: Contralateral Common Femoral Vein: Respiratory phasicity is normal
and symmetric with the symptomatic side. No evidence of thrombus.
Normal compressibility.

Common Femoral Vein: No evidence of thrombus. Normal
compressibility, respiratory phasicity and response to augmentation.

Saphenofemoral Junction: No evidence of thrombus. Normal
compressibility and flow on color Doppler imaging.

Profunda Femoral Vein: No evidence of thrombus. Normal
compressibility and flow on color Doppler imaging.

Femoral Vein: No evidence of thrombus. Normal compressibility,
respiratory phasicity and response to augmentation.

Popliteal Vein: No evidence of thrombus. Normal compressibility,
respiratory phasicity and response to augmentation.

Calf Veins: No evidence of thrombus. Normal compressibility and flow
on color Doppler imaging.

Superficial Great Saphenous Vein: No evidence of thrombus. Normal
compressibility.
IMPRESSION: No evidence of deep venous thrombosis.

## 2022-10-17 DIAGNOSIS — F17210 Nicotine dependence, cigarettes, uncomplicated: Secondary | ICD-10-CM | POA: Diagnosis not present

## 2022-10-17 DIAGNOSIS — M169 Osteoarthritis of hip, unspecified: Secondary | ICD-10-CM | POA: Diagnosis not present

## 2022-10-17 DIAGNOSIS — J449 Chronic obstructive pulmonary disease, unspecified: Secondary | ICD-10-CM | POA: Diagnosis not present

## 2022-10-17 DIAGNOSIS — M810 Age-related osteoporosis without current pathological fracture: Secondary | ICD-10-CM | POA: Diagnosis not present

## 2022-10-17 DIAGNOSIS — F3181 Bipolar II disorder: Secondary | ICD-10-CM | POA: Diagnosis not present

## 2022-10-17 DIAGNOSIS — E7800 Pure hypercholesterolemia, unspecified: Secondary | ICD-10-CM | POA: Diagnosis not present

## 2022-10-17 DIAGNOSIS — I10 Essential (primary) hypertension: Secondary | ICD-10-CM | POA: Diagnosis not present

## 2022-10-17 DIAGNOSIS — G459 Transient cerebral ischemic attack, unspecified: Secondary | ICD-10-CM | POA: Diagnosis not present

## 2022-11-28 DIAGNOSIS — M1611 Unilateral primary osteoarthritis, right hip: Secondary | ICD-10-CM | POA: Diagnosis not present

## 2023-01-13 ENCOUNTER — Other Ambulatory Visit: Payer: Self-pay | Admitting: Internal Medicine

## 2023-01-13 DIAGNOSIS — Z1231 Encounter for screening mammogram for malignant neoplasm of breast: Secondary | ICD-10-CM

## 2023-02-18 ENCOUNTER — Ambulatory Visit: Payer: Medicare Other

## 2023-03-12 ENCOUNTER — Ambulatory Visit: Payer: Medicare HMO

## 2023-03-20 DIAGNOSIS — H43393 Other vitreous opacities, bilateral: Secondary | ICD-10-CM | POA: Diagnosis not present

## 2023-03-20 DIAGNOSIS — H353221 Exudative age-related macular degeneration, left eye, with active choroidal neovascularization: Secondary | ICD-10-CM | POA: Diagnosis not present

## 2023-03-20 DIAGNOSIS — H353112 Nonexudative age-related macular degeneration, right eye, intermediate dry stage: Secondary | ICD-10-CM | POA: Diagnosis not present

## 2023-03-20 DIAGNOSIS — H35033 Hypertensive retinopathy, bilateral: Secondary | ICD-10-CM | POA: Diagnosis not present

## 2023-03-20 DIAGNOSIS — H01022 Squamous blepharitis right lower eyelid: Secondary | ICD-10-CM | POA: Diagnosis not present

## 2023-03-20 DIAGNOSIS — H43813 Vitreous degeneration, bilateral: Secondary | ICD-10-CM | POA: Diagnosis not present

## 2023-04-22 ENCOUNTER — Other Ambulatory Visit: Payer: Self-pay | Admitting: Internal Medicine

## 2023-04-22 DIAGNOSIS — F3181 Bipolar II disorder: Secondary | ICD-10-CM | POA: Diagnosis not present

## 2023-04-22 DIAGNOSIS — R7303 Prediabetes: Secondary | ICD-10-CM | POA: Diagnosis not present

## 2023-04-22 DIAGNOSIS — Z1231 Encounter for screening mammogram for malignant neoplasm of breast: Secondary | ICD-10-CM

## 2023-04-22 DIAGNOSIS — F331 Major depressive disorder, recurrent, moderate: Secondary | ICD-10-CM | POA: Diagnosis not present

## 2023-04-22 DIAGNOSIS — F17210 Nicotine dependence, cigarettes, uncomplicated: Secondary | ICD-10-CM | POA: Diagnosis not present

## 2023-04-22 DIAGNOSIS — J449 Chronic obstructive pulmonary disease, unspecified: Secondary | ICD-10-CM | POA: Diagnosis not present

## 2023-04-22 DIAGNOSIS — E7800 Pure hypercholesterolemia, unspecified: Secondary | ICD-10-CM | POA: Diagnosis not present

## 2023-04-22 DIAGNOSIS — Z1331 Encounter for screening for depression: Secondary | ICD-10-CM | POA: Diagnosis not present

## 2023-04-22 DIAGNOSIS — E871 Hypo-osmolality and hyponatremia: Secondary | ICD-10-CM | POA: Diagnosis not present

## 2023-04-22 DIAGNOSIS — I10 Essential (primary) hypertension: Secondary | ICD-10-CM | POA: Diagnosis not present

## 2023-04-22 DIAGNOSIS — G459 Transient cerebral ischemic attack, unspecified: Secondary | ICD-10-CM | POA: Diagnosis not present

## 2023-04-22 DIAGNOSIS — Z0000 Encounter for general adult medical examination without abnormal findings: Secondary | ICD-10-CM | POA: Diagnosis not present

## 2023-04-22 DIAGNOSIS — Z1239 Encounter for other screening for malignant neoplasm of breast: Secondary | ICD-10-CM | POA: Diagnosis not present

## 2023-05-05 ENCOUNTER — Ambulatory Visit: Payer: Medicare HMO

## 2023-05-13 ENCOUNTER — Ambulatory Visit
Admission: RE | Admit: 2023-05-13 | Discharge: 2023-05-13 | Disposition: A | Discharge status: Home / Self Care | Source: Ambulatory Visit | Attending: Internal Medicine | Admitting: Internal Medicine

## 2023-05-13 DIAGNOSIS — Z1231 Encounter for screening mammogram for malignant neoplasm of breast: Secondary | ICD-10-CM

## 2023-05-20 ENCOUNTER — Ambulatory Visit
Admission: RE | Admit: 2023-05-20 | Discharge: 2023-05-20 | Disposition: A | Discharge status: Home / Self Care | Payer: Medicare HMO | Source: Ambulatory Visit | Attending: Internal Medicine | Admitting: Internal Medicine

## 2023-05-20 DIAGNOSIS — Z122 Encounter for screening for malignant neoplasm of respiratory organs: Secondary | ICD-10-CM | POA: Diagnosis not present

## 2023-05-20 DIAGNOSIS — F17210 Nicotine dependence, cigarettes, uncomplicated: Secondary | ICD-10-CM

## 2023-06-19 DIAGNOSIS — H43393 Other vitreous opacities, bilateral: Secondary | ICD-10-CM | POA: Diagnosis not present

## 2023-06-19 DIAGNOSIS — H43813 Vitreous degeneration, bilateral: Secondary | ICD-10-CM | POA: Diagnosis not present

## 2023-06-19 DIAGNOSIS — H353221 Exudative age-related macular degeneration, left eye, with active choroidal neovascularization: Secondary | ICD-10-CM | POA: Diagnosis not present

## 2023-06-19 DIAGNOSIS — H353112 Nonexudative age-related macular degeneration, right eye, intermediate dry stage: Secondary | ICD-10-CM | POA: Diagnosis not present

## 2023-06-19 DIAGNOSIS — H35033 Hypertensive retinopathy, bilateral: Secondary | ICD-10-CM | POA: Diagnosis not present

## 2023-08-04 DIAGNOSIS — H353112 Nonexudative age-related macular degeneration, right eye, intermediate dry stage: Secondary | ICD-10-CM | POA: Diagnosis not present

## 2023-08-04 DIAGNOSIS — H43813 Vitreous degeneration, bilateral: Secondary | ICD-10-CM | POA: Diagnosis not present

## 2023-08-04 DIAGNOSIS — H43393 Other vitreous opacities, bilateral: Secondary | ICD-10-CM | POA: Diagnosis not present

## 2023-08-04 DIAGNOSIS — H35033 Hypertensive retinopathy, bilateral: Secondary | ICD-10-CM | POA: Diagnosis not present

## 2023-08-04 DIAGNOSIS — H353221 Exudative age-related macular degeneration, left eye, with active choroidal neovascularization: Secondary | ICD-10-CM | POA: Diagnosis not present

## 2023-09-15 DIAGNOSIS — H353221 Exudative age-related macular degeneration, left eye, with active choroidal neovascularization: Secondary | ICD-10-CM | POA: Diagnosis not present

## 2024-01-19 DIAGNOSIS — H353221 Exudative age-related macular degeneration, left eye, with active choroidal neovascularization: Secondary | ICD-10-CM | POA: Diagnosis not present

## 2024-01-19 DIAGNOSIS — H43393 Other vitreous opacities, bilateral: Secondary | ICD-10-CM | POA: Diagnosis not present

## 2024-01-19 DIAGNOSIS — H353113 Nonexudative age-related macular degeneration, right eye, advanced atrophic without subfoveal involvement: Secondary | ICD-10-CM | POA: Diagnosis not present

## 2024-01-19 DIAGNOSIS — H43813 Vitreous degeneration, bilateral: Secondary | ICD-10-CM | POA: Diagnosis not present

## 2024-01-19 DIAGNOSIS — H35033 Hypertensive retinopathy, bilateral: Secondary | ICD-10-CM | POA: Diagnosis not present
